# Patient Record
Sex: Female | Born: 1960 | Race: Black or African American | Hispanic: No | Marital: Single | Smoking: Current every day smoker
Health system: Southern US, Community
[De-identification: ages and names within clinical notes are randomized; demographics above are authoritative.]

## PROBLEM LIST (undated history)

## (undated) DIAGNOSIS — I10 Essential (primary) hypertension: Secondary | ICD-10-CM

## (undated) DIAGNOSIS — R109 Unspecified abdominal pain: Secondary | ICD-10-CM

## (undated) DIAGNOSIS — N809 Endometriosis, unspecified: Secondary | ICD-10-CM

## (undated) DIAGNOSIS — J45909 Unspecified asthma, uncomplicated: Secondary | ICD-10-CM

## (undated) DIAGNOSIS — G4730 Sleep apnea, unspecified: Secondary | ICD-10-CM

## (undated) DIAGNOSIS — N938 Other specified abnormal uterine and vaginal bleeding: Secondary | ICD-10-CM

## (undated) DIAGNOSIS — R51 Headache: Secondary | ICD-10-CM

## (undated) DIAGNOSIS — N949 Unspecified condition associated with female genital organs and menstrual cycle: Secondary | ICD-10-CM

## (undated) DIAGNOSIS — N83209 Unspecified ovarian cyst, unspecified side: Secondary | ICD-10-CM

## (undated) DIAGNOSIS — O009 Unspecified ectopic pregnancy without intrauterine pregnancy: Secondary | ICD-10-CM

## (undated) DIAGNOSIS — N925 Other specified irregular menstruation: Secondary | ICD-10-CM

## (undated) DIAGNOSIS — F411 Generalized anxiety disorder: Secondary | ICD-10-CM

## (undated) DIAGNOSIS — R519 Headache, unspecified: Secondary | ICD-10-CM

## (undated) HISTORY — DX: Unspecified asthma, uncomplicated: J45.909

## (undated) HISTORY — DX: Unspecified ovarian cyst, unspecified side: N83.209

## (undated) HISTORY — DX: Unspecified condition associated with female genital organs and menstrual cycle: N94.9

## (undated) HISTORY — DX: Generalized anxiety disorder: F41.1

## (undated) HISTORY — DX: Other specified abnormal uterine and vaginal bleeding: N93.8

## (undated) HISTORY — PX: ECTOPIC PREGNANCY SURGERY: SHX613

## (undated) HISTORY — PX: OVARIAN CYST REMOVAL: SHX89

## (undated) HISTORY — DX: Other specified irregular menstruation: N92.5

## (undated) HISTORY — DX: Essential (primary) hypertension: I10

## (undated) HISTORY — DX: Unspecified ectopic pregnancy without intrauterine pregnancy: O00.90

## (undated) HISTORY — DX: Endometriosis, unspecified: N80.9

---

## 2004-09-18 ENCOUNTER — Emergency Department: Payer: Self-pay | Admitting: Emergency Medicine

## 2004-09-19 ENCOUNTER — Other Ambulatory Visit: Payer: Self-pay

## 2004-10-30 ENCOUNTER — Emergency Department (HOSPITAL_COMMUNITY): Admission: EM | Admit: 2004-10-30 | Discharge: 2004-10-30 | Payer: Self-pay | Admitting: Emergency Medicine

## 2005-03-04 ENCOUNTER — Emergency Department: Payer: Self-pay | Admitting: Emergency Medicine

## 2005-03-26 ENCOUNTER — Emergency Department: Payer: Self-pay | Admitting: Emergency Medicine

## 2005-03-26 ENCOUNTER — Other Ambulatory Visit: Payer: Self-pay

## 2005-04-15 ENCOUNTER — Emergency Department: Payer: Self-pay | Admitting: Emergency Medicine

## 2005-06-09 ENCOUNTER — Emergency Department: Payer: Self-pay | Admitting: Emergency Medicine

## 2005-06-09 ENCOUNTER — Other Ambulatory Visit: Payer: Self-pay

## 2006-11-28 ENCOUNTER — Emergency Department: Payer: Self-pay | Admitting: Emergency Medicine

## 2007-07-05 ENCOUNTER — Emergency Department: Payer: Self-pay | Admitting: Emergency Medicine

## 2008-03-22 ENCOUNTER — Emergency Department: Payer: Self-pay | Admitting: Emergency Medicine

## 2008-03-24 ENCOUNTER — Ambulatory Visit: Payer: Self-pay | Admitting: Emergency Medicine

## 2008-03-27 ENCOUNTER — Ambulatory Visit: Payer: Self-pay | Admitting: Unknown Physician Specialty

## 2008-03-27 ENCOUNTER — Inpatient Hospital Stay: Payer: Self-pay | Admitting: Unknown Physician Specialty

## 2009-08-22 ENCOUNTER — Emergency Department: Payer: Self-pay | Admitting: Emergency Medicine

## 2009-08-23 ENCOUNTER — Emergency Department: Payer: Self-pay | Admitting: Emergency Medicine

## 2011-11-16 ENCOUNTER — Emergency Department: Payer: Self-pay | Admitting: Emergency Medicine

## 2011-11-16 LAB — URINALYSIS, COMPLETE
Glucose,UR: NEGATIVE mg/dL (ref 0–75)
Nitrite: NEGATIVE
RBC,UR: 5 /HPF (ref 0–5)
Specific Gravity: 1.026 (ref 1.003–1.030)
WBC UR: 1 /HPF (ref 0–5)

## 2011-11-16 LAB — COMPREHENSIVE METABOLIC PANEL
Alkaline Phosphatase: 82 U/L (ref 50–136)
BUN: 11 mg/dL (ref 7–18)
Bilirubin,Total: 0.9 mg/dL (ref 0.2–1.0)
Chloride: 103 mmol/L (ref 98–107)
Co2: 27 mmol/L (ref 21–32)
Creatinine: 0.92 mg/dL (ref 0.60–1.30)
EGFR (African American): >60
Osmolality: 277 (ref 275–301)
Sodium: 139 mmol/L (ref 136–145)
Total Protein: 8.2 g/dL (ref 6.4–8.2)

## 2011-11-16 LAB — CBC
HCT: 45.3 % (ref 35.0–47.0)
MCH: 30.9 pg (ref 26.0–34.0)
MCHC: 33.7 g/dL (ref 32.0–36.0)
MCV: 92 fL (ref 80–100)
Platelet: 252 10*3/uL (ref 150–440)
RBC: 4.94 10*6/uL (ref 3.80–5.20)
RDW: 13.6 % (ref 11.5–14.5)
WBC: 8.2 10*3/uL (ref 3.6–11.0)

## 2011-11-16 LAB — CK TOTAL AND CKMB (NOT AT ARMC): CK-MB: <0.5 ng/mL — ABNORMAL LOW (ref 0.5–3.6)

## 2011-11-19 ENCOUNTER — Encounter: Payer: Self-pay | Admitting: Cardiovascular Disease

## 2011-11-22 ENCOUNTER — Encounter: Payer: Self-pay | Admitting: *Deleted

## 2011-11-23 ENCOUNTER — Encounter: Payer: Self-pay | Admitting: Cardiovascular Disease

## 2011-12-03 ENCOUNTER — Encounter: Payer: Self-pay | Admitting: Cardiovascular Disease

## 2011-12-14 ENCOUNTER — Encounter: Payer: Self-pay | Admitting: Cardiovascular Disease

## 2011-12-17 ENCOUNTER — Encounter: Payer: Self-pay | Admitting: Cardiovascular Disease

## 2011-12-24 ENCOUNTER — Encounter: Payer: Self-pay | Admitting: Cardiovascular Disease

## 2012-01-04 ENCOUNTER — Encounter: Payer: Self-pay | Admitting: Cardiovascular Disease

## 2012-01-04 ENCOUNTER — Ambulatory Visit (INDEPENDENT_AMBULATORY_CARE_PROVIDER_SITE_OTHER): Payer: Self-pay | Admitting: Cardiovascular Disease

## 2012-01-04 VITALS — BP 126/91 | HR 81 | Ht 65.5 in | Wt 137.5 lb

## 2012-01-04 DIAGNOSIS — R0602 Shortness of breath: Secondary | ICD-10-CM

## 2012-01-04 DIAGNOSIS — R002 Palpitations: Secondary | ICD-10-CM | POA: Insufficient documentation

## 2012-01-04 NOTE — Patient Instructions (Addendum)
Your physician has recommended that you wear a holter monitor. Holter monitors are medical devices that record the heart's electrical activity. Doctors most often use these monitors to diagnose arrhythmias. Arrhythmias are problems with the speed or rhythm of the heartbeat. The monitor is a small, portable device. You can wear one while you do your normal daily activities. This is usually used to diagnose what is causing palpitations/syncope (passing out).  Will call you with results.

## 2012-01-04 NOTE — Assessment & Plan Note (Signed)
The patient's palpitations are suggestive of premature beats and possible supraventricular tachycardia. She did have SVT in 2007. Her baseline ECG is normal with no evidence of a delta wave and normal QT interval. She also has a lot of anxiety symptoms and these might be contributing. I recommend evaluation with a 48-hour Holter monitor. She did have an echocardiogram in the past and was unremarkable. I will consider increasing the dose of metoprolol. She also might need to have her thyroid function checked if that has not already been done.

## 2012-01-04 NOTE — Progress Notes (Signed)
HPI  This is a 51 year old female who is here today for evaluation of palpitations and followup from the emergency room at [facility]. She has history of hypertension, anxiety and tobacco use. In 2007, she presented to [facility] with palpitations. She was noted to have wide complex tachycardia at a heart rate of 150 beats per minute which was felt to be due to SVT with aberrant conduction. She was seen by Dr. Juliann Pares at that time. According to the notes, she had a stress test and an echocardiogram. Both of them were unremarkable. She was treated with small dose metoprolol as well as Xanax for anxiety. She reports recurrent brief palpitations since then. However, last month she had an episode that lasted longer than usual. She felt skipping in her heart and slightly fast heartbeats. She was extremely dizzy and had a presyncopal episode. These episodes are usually very random it can happen at any time. They can happen even if she is not stressed. She denies any chest pain or significant dyspnea. She had basic workup in the emergency room. Her labs were unremarkable. ECG showed normal sinus rhythm. She works as a Lawyer.  No Known Allergies   Current Outpatient Prescriptions on File Prior to Visit  Medication Sig Dispense Refill  . ALPRAZolam (XANAX) 0.5 MG tablet Take 0.5 mg by mouth at bedtime as needed.      . hydrochlorothiazide (MICROZIDE) 12.5 MG capsule Take 12.5 mg by mouth daily.      . metoprolol tartrate (LOPRESSOR) 12.5 mg TABS Take 12.5 mg by mouth 2 (two) times daily.         Past Medical History  Diagnosis Date  . Other and unspecified ovarian cyst   . Essential hypertension, benign   . Anxiety state, unspecified   . Other disorder of menstruation and other abnormal bleeding from female genital tract   . Ectopic pregnancy   . Asthma      Past Surgical History  Procedure Date  . Ectopic pregnancy surgery      Family History  Problem Relation Age of Onset  . Hypertension Mother     . Hypertension Father   . Hypertension Sister   . Hypertension Maternal Grandmother   . Heart disease Maternal Grandfather   . Hypertension Sister      History   Social History  . Marital Status: Single    Spouse Name: N/A    Number of Children: N/A  . Years of Education: N/A   Occupational History  . Not on file.   Social History Main Topics  . Smoking status: Current Every Day Smoker -- 2.0 packs/day for 10 years    Types: Cigarettes  . Smokeless tobacco: Not on file  . Alcohol Use: Yes     socially  . Drug Use: No  . Sexually Active:    Other Topics Concern  . Not on file   Social History Narrative  . No narrative on file     ROS Constitutional: Negative for fever, chills, diaphoresis, activity change, appetite change and fatigue.  HENT: Negative for hearing loss, nosebleeds, congestion, sore throat, facial swelling, drooling, trouble swallowing, neck pain, voice change, sinus pressure and tinnitus.  Eyes: Negative for photophobia, pain, discharge and visual disturbance.  Respiratory: Negative for apnea, cough, chest tightness and wheezing.  Cardiovascular: Negative for chest pain and leg swelling.  Gastrointestinal: Negative for nausea, vomiting, abdominal pain, diarrhea, constipation, blood in stool and abdominal distention.  Genitourinary: Negative for dysuria, urgency, frequency, hematuria  and decreased urine volume.  Musculoskeletal: Negative for myalgias, back pain, joint swelling, arthralgias and gait problem.  Skin: Negative for color change, pallor, rash and wound.  Neurological: Negative for dizziness, tremors, seizures, syncope, speech difficulty, weakness, light-headedness, numbness and headaches.  Psychiatric/Behavioral: Negative for suicidal ideas, hallucinations, behavioral problems and agitation. The patient is not nervous/anxious.     PHYSICAL EXAM   BP 126/91  Pulse 81  Ht 5' 5.5" (1.664 m)  Wt 137 lb 8 oz (62.37 kg)  BMI 22.53  kg/m2 Constitutional: She is oriented to person, place, and time. She appears well-developed and well-nourished. No distress.  HENT: No nasal discharge.  Head: Normocephalic and atraumatic.  Eyes: Pupils are equal and round. Right eye exhibits no discharge. Left eye exhibits no discharge.  Neck: Normal range of motion. Neck supple. No JVD present. No thyromegaly present.  Cardiovascular: Normal rate, regular rhythm, normal heart sounds. Exam reveals no gallop and no friction rub. No murmur heard.  Pulmonary/Chest: Effort normal and breath sounds normal. No stridor. No respiratory distress. She has no wheezes. She has no rales. She exhibits no tenderness.  Abdominal: Soft. Bowel sounds are normal. She exhibits no distension. There is no tenderness. There is no rebound and no guarding.  Musculoskeletal: Normal range of motion. She exhibits no edema and no tenderness.  Neurological: She is alert and oriented to person, place, and time. Coordination normal.  Skin: Skin is warm and dry. No rash noted. She is not diaphoretic. No erythema. No pallor.  Psychiatric: She has a normal mood and affect. Her behavior is normal. Judgment and thought content normal.     EKG: Sinus  Rhythm  WITHIN NORMAL LIMITS   ASSESSMENT AND PLAN

## 2013-11-06 ENCOUNTER — Ambulatory Visit: Payer: Self-pay | Admitting: Physician Assistant

## 2015-03-23 ENCOUNTER — Encounter: Payer: Self-pay | Admitting: Emergency Medicine

## 2015-03-23 ENCOUNTER — Emergency Department
Admission: EM | Admit: 2015-03-23 | Discharge: 2015-03-23 | Disposition: A | Discharge status: Home / Self Care | Payer: Self-pay | Attending: Emergency Medicine | Admitting: Emergency Medicine

## 2015-03-23 ENCOUNTER — Emergency Department: Payer: Self-pay

## 2015-03-23 DIAGNOSIS — F17210 Nicotine dependence, cigarettes, uncomplicated: Secondary | ICD-10-CM | POA: Insufficient documentation

## 2015-03-23 DIAGNOSIS — R42 Dizziness and giddiness: Secondary | ICD-10-CM | POA: Insufficient documentation

## 2015-03-23 DIAGNOSIS — I493 Ventricular premature depolarization: Secondary | ICD-10-CM | POA: Insufficient documentation

## 2015-03-23 DIAGNOSIS — Z79899 Other long term (current) drug therapy: Secondary | ICD-10-CM | POA: Insufficient documentation

## 2015-03-23 DIAGNOSIS — J45901 Unspecified asthma with (acute) exacerbation: Secondary | ICD-10-CM | POA: Insufficient documentation

## 2015-03-23 DIAGNOSIS — I10 Essential (primary) hypertension: Secondary | ICD-10-CM | POA: Insufficient documentation

## 2015-03-23 LAB — URINALYSIS COMPLETE WITH MICROSCOPIC (ARMC ONLY)
Bacteria, UA: NONE SEEN
Bilirubin Urine: NEGATIVE
GLUCOSE, UA: NEGATIVE mg/dL
Hgb urine dipstick: NEGATIVE
KETONES UR: NEGATIVE mg/dL
Leukocytes, UA: NEGATIVE
NITRITE: NEGATIVE
PROTEIN: NEGATIVE mg/dL
SPECIFIC GRAVITY, URINE: 1.017 (ref 1.005–1.030)
pH: 7 (ref 5.0–8.0)

## 2015-03-23 LAB — BASIC METABOLIC PANEL
Anion gap: 4 — ABNORMAL LOW (ref 5–15)
BUN: 9 mg/dL (ref 6–20)
CALCIUM: 9 mg/dL (ref 8.9–10.3)
CO2: 28 mmol/L (ref 22–32)
Chloride: 109 mmol/L (ref 101–111)
Creatinine, Ser: 0.79 mg/dL (ref 0.44–1.00)
GFR calc Af Amer: >60 mL/min (ref >60)
GLUCOSE: 86 mg/dL (ref 65–99)
POTASSIUM: 3.6 mmol/L (ref 3.5–5.1)
SODIUM: 141 mmol/L (ref 135–145)

## 2015-03-23 LAB — TROPONIN I: Troponin I: <0.03 ng/mL (ref <0.031)

## 2015-03-23 LAB — CBC
HCT: 39.9 % (ref 35.0–47.0)
HEMOGLOBIN: 13 g/dL (ref 12.0–16.0)
MCH: 27.2 pg (ref 26.0–34.0)
MCHC: 32.5 g/dL (ref 32.0–36.0)
MCV: 83.5 fL (ref 80.0–100.0)
Platelets: 297 10*3/uL (ref 150–440)
RBC: 4.78 MIL/uL (ref 3.80–5.20)
RDW: 16.2 % — ABNORMAL HIGH (ref 11.5–14.5)
WBC: 7.8 10*3/uL (ref 3.6–11.0)

## 2015-03-23 NOTE — ED Notes (Signed)
Pt reports feeling light headed/dizzy/SOB since Friday.

## 2015-03-23 NOTE — ED Notes (Signed)
AAOx3.  Skin warm and dry.  NAD.  Ambualates with easy and steady gait. Moving all extremities equally and strong.

## 2015-03-23 NOTE — ED Provider Notes (Signed)
[facility] Emergency Department Provider Note  ____________________________________________  Time seen: Approximately 5:39 PM  I have reviewed the triage vital signs and the nursing notes.   HISTORY  Chief Complaint Dizziness and Shortness of Breath    HPI Tanya Cline is a 54 y.o. female history of anemia, HTN with no recent medication changes, presenting with lightheadedness.Patient reports that since Friday she has been having lightheadedness "like I'm going to pass out" that is worse with standing. She feels that she may have some shortness of breath or "I can't tell if I'm short of breath because I'm feeling anxious" associated with this. No chest pain, tightness, or pressure sensation. No palpitations. No numbness tingling or weakness, headache. No recent nausea, vomiting diarrhea fever or cough or cold symptoms. She has been taking normal by mouth. No recent changes in her medications other than increase in her blood pressure medication several months ago. No trauma. No black or tarry-colored looking stools.   Past Medical History  Diagnosis Date  . Other and unspecified ovarian cyst   . Essential hypertension, benign   . Anxiety state, unspecified   . Other disorder of menstruation and other abnormal bleeding from female genital tract   . Ectopic pregnancy   . Asthma     Patient Active Problem List   Diagnosis Date Noted  . Palpitations 01/04/2012    Past Surgical History  Procedure Laterality Date  . Ectopic pregnancy surgery      Current Outpatient Rx  Name  Route  Sig  Dispense  Refill  . ALPRAZolam (XANAX) 0.5 MG tablet   Oral   Take 0.5 mg by mouth at bedtime as needed.         . hydrochlorothiazide (MICROZIDE) 12.5 MG capsule   Oral   Take 12.5 mg by mouth daily.         . metoprolol tartrate (LOPRESSOR) 12.5 mg TABS   Oral   Take 12.5 mg by mouth 2 (two) times daily.         . Multiple Vitamin  (MULTIVITAMIN) tablet   Oral   Take 1 tablet by mouth daily.           Allergies Review of patient's allergies indicates no known allergies.  Family History  Problem Relation Age of Onset  . Hypertension Mother   . Hypertension Father   . Hypertension Sister   . Hypertension Maternal Grandmother   . Heart disease Maternal Grandfather   . Hypertension Sister     Social History Social History  Substance Use Topics  . Smoking status: Current Every Day Smoker -- 2.00 packs/day for 10 years    Types: Cigarettes  . Smokeless tobacco: None  . Alcohol Use: Yes     Comment: socially    Review of Systems Constitutional: No fever/chills. Positive lightheadedness. No syncope. Eyes: No visual changes. No blurred or double vision. ENT: No sore throat. No cough or cold symptoms. Cardiovascular: Denies chest pain, palpitations. Respiratory: Positive shortness of breath.  No cough. Gastrointestinal: No abdominal pain.  No nausea, no vomiting.  No diarrhea.  No constipation. Genitourinary: Negative for dysuria. Musculoskeletal: Negative for back pain. Skin: Negative for rash. Neurological: Negative for headaches, focal weakness or numbness.  10-point ROS otherwise negative.  ____________________________________________   PHYSICAL EXAM:  VITAL SIGNS: ED Triage Vitals  Enc Vitals Group     BP 03/23/15 1649 151/83 mmHg     Pulse Rate 03/23/15 1649 97     Resp 03/23/15  1649 18     Temp 03/23/15 1649 97.8 F (36.6 C)     Temp Source 03/23/15 1649 Oral     SpO2 03/23/15 1649 96 %     Weight 03/23/15 1649 130 lb (58.968 kg)     Height --      Head Cir --      Peak Flow --      Pain Score 03/23/15 1708 0     Pain Loc --      Pain Edu? --      Excl. in [place]? --     Constitutional: Alert and oriented. Well appearing and in no acute distress. Answer question appropriately. Eyes: Conjunctivae are normal.  EOMI. no conjunctival pallor. Head: Atraumatic. Nose: No  congestion/rhinnorhea. Mouth/Throat: Mucous membranes are moist.  Neck: No stridor.  Supple.  No JVD. Cardiovascular: Normal rate, regular rhythm. No murmurs, rubs or gallops.  Respiratory: Normal respiratory effort.  No retractions. Lungs CTAB.  No wheezes, rales or ronchi. Gastrointestinal: Soft and nontender. No distention. No peritoneal signs. Musculoskeletal: No LE edema. No palpable cords, calf pain, or Homans sign. Neurologic:  Alert and oriented 3. Face and smile are symmetric. Normal speech. EOMI and PERRLA. Moves all extremities well. Normal gait without ataxia.  Skin:  Skin is warm, dry and intact. No rash noted. Psychiatric: Mood and affect are normal. Speech and behavior are normal.  Normal judgement.  ____________________________________________   LABS (all labs ordered are listed, but only abnormal results are displayed)  Labs Reviewed  BASIC METABOLIC PANEL - Abnormal; Notable for the following:    Anion gap 4 (*)    All other components within normal limits  CBC - Abnormal; Notable for the following:    RDW 16.2 (*)    All other components within normal limits  URINALYSIS COMPLETEWITH MICROSCOPIC ([facility] ONLY) - Abnormal; Notable for the following:    Color, Urine YELLOW (*)    APPearance HAZY (*)    Squamous Epithelial / LPF 6-30 (*)    All other components within normal limits  TROPONIN I  CBG MONITORING, ED   ____________________________________________  EKG  ED ECG REPORT I, Eula Listen, the attending physician, personally viewed and interpreted this ECG.   Date: 03/23/2015  EKG Time: 1654  Rate: 73  Rhythm: normal sinus rhythm  Axis: Normal  Intervals:none  ST&T Change: Nonspecific T-wave inversion in V1. No ischemic changes.  ____________________________________________  RADIOLOGY  No results found.  ____________________________________________   PROCEDURES  Procedure(s) performed: None  Critical Care performed:  No ____________________________________________   INITIAL IMPRESSION / ASSESSMENT AND PLAN / ED COURSE  Pertinent labs & imaging results that were available during my care of the patient were reviewed by me and considered in my medical decision making (see chart for details).  54 y.o. female with a history of anemia, HTN on antihypertensives, presenting with 3 days of lightheadedness which is worse with standing. She has no clinical history or signs or symptoms of infection but I will rule out UTI; pneumonia would be very unlikely. We will also check her for anemia. Rule out electrolyte abnormality or arrhythmia. If the patient's workup here is negative, I anticipate discharge home with PCP follow-up. It is possible that she is symptomatic from her medications, but her blood pressure today is not low nor is she bradycardic. The patient does have 1 PVC on her EKG and is possible that she is having more than this, but at the rate that we are seeing on  the EKG it is unlikely this would be the cause of her lightheadedness.  ____________________________________________  FINAL CLINICAL IMPRESSION(S) / ED DIAGNOSES  Final diagnoses:  Lightheadedness  PVC (premature ventricular contraction)      NEW MEDICATIONS STARTED DURING THIS VISIT:  New Prescriptions   No medications on file     Eula Listen, MD 03/23/15 1813

## 2015-03-23 NOTE — ED Notes (Signed)
Also c/o recent history of right ear pain.

## 2015-03-23 NOTE — Discharge Instructions (Signed)
Please drink plenty of fluid to stay well hydrated. Please make a follow-up appointment with your primary care physician.  Please return to the emergency department if he develops severe pain, numbness tingling or weakness, severe headache, vomiting, fever, or any other symptoms concerning to you. Dizziness Dizziness is a common problem. It is a feeling of unsteadiness or light-headedness. You may feel like you are about to faint. Dizziness can lead to injury if you stumble or fall. Anyone can become dizzy, but dizziness is more common in older adults. This condition can be caused by a number of things, including medicines, dehydration, or illness. HOME CARE INSTRUCTIONS Taking these steps may help with your condition: Eating and Drinking  Drink enough fluid to keep your urine clear or pale yellow. This helps to keep you from becoming dehydrated. Try to drink more clear fluids, such as water.  Do not drink alcohol.  Limit your caffeine intake if directed by your health care provider.  Limit your salt intake if directed by your health care provider. Activity  Avoid making quick movements.  Rise slowly from chairs and steady yourself until you feel okay.  In the morning, first sit up on the side of the bed. When you feel okay, stand slowly while you hold onto something until you know that your balance is fine.  Move your legs often if you need to stand in one place for a long time. Tighten and relax your muscles in your legs while you are standing.  Do not drive or operate heavy machinery if you feel dizzy.  Avoid bending down if you feel dizzy. Place items in your home so that they are easy for you to reach without leaning over. Lifestyle  Do not use any tobacco products, including cigarettes, chewing tobacco, or electronic cigarettes. If you need help quitting, ask your health care provider.  Try to reduce your stress level, such as with yoga or meditation. Talk with your health care  provider if you need help. General Instructions  Watch your dizziness for any changes.  Take medicines only as directed by your health care provider. Talk with your health care provider if you think that your dizziness is caused by a medicine that you are taking.  Tell a friend or a family member that you are feeling dizzy. If he or she notices any changes in your behavior, have this person call your health care provider.  Keep all follow-up visits as directed by your health care provider. This is important. SEEK MEDICAL CARE IF:  Your dizziness does not go away.  Your dizziness or light-headedness gets worse.  You feel nauseous.  You have reduced hearing.  You have new symptoms.  You are unsteady on your feet or you feel like the room is spinning. SEEK IMMEDIATE MEDICAL CARE IF:  You vomit or have diarrhea and are unable to eat or drink anything.  You have problems talking, walking, swallowing, or using your arms, hands, or legs.  You feel generally weak.  You are not thinking clearly or you have trouble forming sentences. It may take a friend or family member to notice this.  You have chest pain, abdominal pain, shortness of breath, or sweating.  Your vision changes.  You notice any bleeding.  You have a headache.  You have neck pain or a stiff neck.  You have a fever.   This information is not intended to replace advice given to you by your health care provider. Make sure you discuss  any questions you have with your health care provider.   Document Released: 09/22/2000 Document Revised: 08/13/2014 Document Reviewed: 03/25/2014 Elsevier Interactive Patient Education [employer].

## 2015-06-15 ENCOUNTER — Encounter: Payer: Self-pay | Admitting: Gynecology

## 2015-06-15 ENCOUNTER — Ambulatory Visit (INDEPENDENT_AMBULATORY_CARE_PROVIDER_SITE_OTHER): Payer: Self-pay

## 2015-06-15 ENCOUNTER — Ambulatory Visit
Admission: EM | Admit: 2015-06-15 | Discharge: 2015-06-15 | Disposition: A | Discharge status: Home / Self Care | Payer: Self-pay | Attending: Family Medicine | Admitting: Family Medicine

## 2015-06-15 DIAGNOSIS — M940 Chondrocostal junction syndrome [Tietze]: Secondary | ICD-10-CM

## 2015-06-15 DIAGNOSIS — J069 Acute upper respiratory infection, unspecified: Secondary | ICD-10-CM

## 2015-06-15 NOTE — ED Notes (Signed)
Patient stated was congestion x 1 week ago and was given amoxicillin. Today patient stated with chest pain and was concerned.

## 2015-06-15 NOTE — ED Provider Notes (Addendum)
CSN: [id number]     Arrival date & time 06/15/15  1325 History   First MD Initiated Contact with Patient 06/15/15 1437     Chief Complaint  Patient presents with  . Chest Pain   (Consider location/radiation/quality/duration/timing/severity/associated sxs/prior Treatment) HPI: Patient presents today with symptoms of chest congestion for a week. Patient's primary care physician did give her amoxicillin. She took it for a few days and then decided that the medicine was making her feel "weird." Today she started to have some discomfort in her chest with coughing. She denies any shortness of breath, diaphoresis, fever, headache. She also noticed the pain with certain movements. She denies any known history of cardiac problems. She denies any family history of some cardiac death. She is a smoker. Patient notes to having asthma as a child. She does not use an albuterol inhaler anymore.  Past Medical History  Diagnosis Date  . Other and unspecified ovarian cyst   . Essential hypertension, benign   . Anxiety state, unspecified   . Other disorder of menstruation and other abnormal bleeding from female genital tract   . Ectopic pregnancy   . Asthma    Past Surgical History  Procedure Laterality Date  . Ectopic pregnancy surgery     Family History  Problem Relation Age of Onset  . Hypertension Mother   . Hypertension Father   . Hypertension Sister   . Hypertension Maternal Grandmother   . Heart disease Maternal Grandfather   . Hypertension Sister    Social History  Substance Use Topics  . Smoking status: Current Every Day Smoker -- 2.00 packs/day for 10 years    Types: Cigarettes  . Smokeless tobacco: None  . Alcohol Use: Yes     Comment: socially   OB History    No data available     Review of Systems: Negative except mentioned above.   Allergies  Review of patient's allergies indicates no known allergies.  Home Medications   Prior to Admission medications   Medication Sig  Start Date End Date Taking? Authorizing Provider  clonazePAM (KLONOPIN) 0.5 MG tablet Take 0.5 mg by mouth 2 (two) times daily as needed for anxiety.   Yes Historical Provider, MD  Multiple Vitamin (MULTIVITAMIN) tablet Take 1 tablet by mouth daily.   Yes Historical Provider, MD  verapamil (CALAN-SR) 180 MG CR tablet Take 180 mg by mouth at bedtime.   Yes Historical Provider, MD  ALPRAZolam Duanne Moron) 0.5 MG tablet Take 0.5 mg by mouth at bedtime as needed.    Historical Provider, MD  hydrochlorothiazide (MICROZIDE) 12.5 MG capsule Take 12.5 mg by mouth daily.    Historical Provider, MD  metoprolol tartrate (LOPRESSOR) 12.5 mg TABS Take 12.5 mg by mouth 2 (two) times daily.    Historical Provider, MD   Meds Ordered and Administered this Visit  Medications - No data to display  BP 151/89 mmHg  Pulse 83  Temp(Src) 97.6 F (36.4 C) (Oral)  Resp 16  Ht 5\' 6"  (1.676 m)  Wt 140 lb (63.504 kg)  BMI 22.61 kg/m2  SpO2 99%  LMP 03/17/2015 (Approximate) No data found.   Physical Exam:  GENERAL: NAD HEENT: no pharyngeal erythema, no exudate, no erythema of TMs, no cervical LAD RESP: CTA B, minimal tenderness to palpation along right parasternal area CARD: RRR NEURO: CN II-XII grossly intact   ED Course  Procedures (including critical care time)  Labs Review Labs Reviewed - No data to display  Imaging Review Dg Chest 2  View  06/15/2015  CLINICAL DATA:  Chest discomfort for 1 week with cough and congestion EXAM: CHEST  2 VIEW COMPARISON:  03/23/2015 FINDINGS: Mild left base scarring stable. Heart size and vascular pattern normal. Lungs otherwise clear. IMPRESSION: No acute findings Electronically Signed   By: Skipper Cliche M.D.   On: 06/15/2015 15:28     MDM   1. Costochondritis   2. URI (upper respiratory infection)   Discussed results of chest x-ray with patient and also EKG. EKG shows normal sinus rhythm with reading that states possible left atrial enlargement with no ST elevation  or depression no T-wave inversions noted P waves seen, agree with printout version of EKG. Discussed with patient that I would recommend her taking a cough suppressant and taking ibuprofen when necessary for her symptoms. I do not feel at this point that her symptoms are cardiac in nature. If her symptoms do persist or worsen I do recommend that she seek medical attention as discussed. Encouraged smoking cessation.    Paulina Fusi, MD 06/15/15 1544  Paulina Fusi, MD 06/15/15 [phone]

## 2015-08-15 ENCOUNTER — Encounter: Payer: Self-pay | Admitting: *Deleted

## 2015-08-15 ENCOUNTER — Ambulatory Visit
Admission: EM | Admit: 2015-08-15 | Discharge: 2015-08-15 | Disposition: A | Discharge status: Home / Self Care | Payer: Self-pay | Attending: Family Medicine | Admitting: Family Medicine

## 2015-08-15 DIAGNOSIS — R42 Dizziness and giddiness: Secondary | ICD-10-CM

## 2015-08-15 DIAGNOSIS — R748 Abnormal levels of other serum enzymes: Secondary | ICD-10-CM

## 2015-08-15 DIAGNOSIS — R5383 Other fatigue: Secondary | ICD-10-CM

## 2015-08-15 LAB — CBC WITH DIFFERENTIAL/PLATELET
Basophils Absolute: 0.1 10*3/uL (ref 0–0.1)
Basophils Relative: 1 %
EOS ABS: 0.2 10*3/uL (ref 0–0.7)
Eosinophils Relative: 2 %
HEMATOCRIT: 38.7 % (ref 35.0–47.0)
HEMOGLOBIN: 12.5 g/dL (ref 12.0–16.0)
LYMPHS ABS: 1.9 10*3/uL (ref 1.0–3.6)
Lymphocytes Relative: 21 %
MCH: 26.7 pg (ref 26.0–34.0)
MCHC: 32.2 g/dL (ref 32.0–36.0)
MCV: 82.9 fL (ref 80.0–100.0)
Monocytes Absolute: 0.9 10*3/uL (ref 0.2–0.9)
NEUTROS ABS: 6 10*3/uL (ref 1.4–6.5)
Platelets: 263 10*3/uL (ref 150–440)
RBC: 4.67 MIL/uL (ref 3.80–5.20)
RDW: 16.2 % — ABNORMAL HIGH (ref 11.5–14.5)
WBC: 9.1 10*3/uL (ref 3.6–11.0)

## 2015-08-15 LAB — COMPREHENSIVE METABOLIC PANEL
ALK PHOS: 80 U/L (ref 38–126)
ALT: 64 U/L — ABNORMAL HIGH (ref 14–54)
AST: 51 U/L — ABNORMAL HIGH (ref 15–41)
Albumin: 3.9 g/dL (ref 3.5–5.0)
Anion gap: 7 (ref 5–15)
BILIRUBIN TOTAL: 0.7 mg/dL (ref 0.3–1.2)
BUN: 13 mg/dL (ref 6–20)
CHLORIDE: 105 mmol/L (ref 101–111)
CO2: 24 mmol/L (ref 22–32)
CREATININE: 0.79 mg/dL (ref 0.44–1.00)
Calcium: 9.3 mg/dL (ref 8.9–10.3)
GFR calc non Af Amer: >60 mL/min (ref >60)
Glucose, Bld: 97 mg/dL (ref 65–99)
Potassium: 3.8 mmol/L (ref 3.5–5.1)
Sodium: 136 mmol/L (ref 135–145)
Total Protein: 7.4 g/dL (ref 6.5–8.1)

## 2015-08-15 NOTE — ED Notes (Signed)
Pt took her BP med this am and shortly thereafter began feeling tired, weak, headache and dizzy.

## 2015-09-23 NOTE — ED Provider Notes (Signed)
CSN: [id number]     Arrival date & time 08/15/15  1601 History   First MD Initiated Contact with Patient 08/15/15 1749     Chief Complaint  Patient presents with  . Dizziness  . Weakness   (Consider location/radiation/quality/duration/timing/severity/associated sxs/prior Treatment) HPI Comments: 55 yo female with a c/o dizziness since this morning after taking her blood pressure medication. States also felt tired, weak, fatigued and developed a headache. Denies any fevers, chills, vision changes, numbness/tingling or one-sided weakness.  Patient is a 55 y.o. female presenting with dizziness and weakness. The history is provided by the patient.  Dizziness Quality:  Lightheadedness Associated symptoms: weakness   Weakness    Past Medical History  Diagnosis Date  . Other and unspecified ovarian cyst   . Essential hypertension, benign   . Anxiety state, unspecified   . Other disorder of menstruation and other abnormal bleeding from female genital tract   . Ectopic pregnancy   . Asthma    Past Surgical History  Procedure Laterality Date  . Ectopic pregnancy surgery     Family History  Problem Relation Age of Onset  . Hypertension Mother   . Hypertension Father   . Hypertension Sister   . Hypertension Maternal Grandmother   . Heart disease Maternal Grandfather   . Hypertension Sister    Social History  Substance Use Topics  . Smoking status: Current Every Day Smoker -- 2.00 packs/day for 10 years    Types: Cigarettes  . Smokeless tobacco: None  . Alcohol Use: Yes     Comment: socially   OB History    No data available     Review of Systems  Neurological: Positive for dizziness and weakness.    Allergies  Review of patient's allergies indicates no known allergies.  Home Medications   Prior to Admission medications   Medication Sig Start Date End Date Taking? Authorizing Provider  Multiple Vitamin (MULTIVITAMIN) tablet Take 1 tablet by mouth daily.   Yes  Historical Provider, MD  verapamil (CALAN-SR) 180 MG CR tablet Take 180 mg by mouth at bedtime.   Yes Historical Provider, MD  ALPRAZolam Duanne Moron) 0.5 MG tablet Take 0.5 mg by mouth at bedtime as needed.    Historical Provider, MD  clonazePAM (KLONOPIN) 0.5 MG tablet Take 0.5 mg by mouth 2 (two) times daily as needed for anxiety.    Historical Provider, MD  hydrochlorothiazide (MICROZIDE) 12.5 MG capsule Take 12.5 mg by mouth daily.    Historical Provider, MD  metoprolol tartrate (LOPRESSOR) 12.5 mg TABS Take 12.5 mg by mouth 2 (two) times daily.    Historical Provider, MD   Meds Ordered and Administered this Visit  Medications - No data to display  BP 128/90 mmHg  Pulse 84  Temp(Src) 98.1 F (36.7 C) (Oral)  Resp 16  Ht 5\' 6"  (1.676 m)  Wt 140 lb (63.504 kg)  BMI 22.61 kg/m2  SpO2 100%  LMP 07/16/2015 (Approximate) No data found.   Physical Exam  Constitutional: She is oriented to person, place, and time. She appears well-developed and well-nourished. No distress.  HENT:  Head: Normocephalic.  Right Ear: Tympanic membrane, external ear and ear canal normal.  Left Ear: Tympanic membrane, external ear and ear canal normal.  Nose: Nose normal.  Mouth/Throat: Uvula is midline, oropharynx is clear and moist and mucous membranes are normal.  Eyes: Conjunctivae and EOM are normal. Pupils are equal, round, and reactive to light. Right eye exhibits no discharge. Left eye exhibits no discharge.  No scleral icterus.  Neck: Normal range of motion. Neck supple. No JVD present. No tracheal deviation present. No thyromegaly present.  Cardiovascular: Normal rate, regular rhythm, normal heart sounds and intact distal pulses.   No murmur heard. Pulmonary/Chest: Effort normal and breath sounds normal. No stridor. No respiratory distress. She has no wheezes. She has no rales. She exhibits no tenderness.  Abdominal: Soft. Bowel sounds are normal. She exhibits no distension and no mass. There is no  tenderness. There is no rebound and no guarding.  Musculoskeletal: She exhibits no edema or tenderness.  Lymphadenopathy:    She has no cervical adenopathy.  Neurological: She is alert and oriented to person, place, and time. She has normal reflexes.  Skin: Skin is warm and dry. No rash noted. She is not diaphoretic. No erythema. No pallor.  Psychiatric: She has a normal mood and affect. Her behavior is normal. Judgment and thought content normal.  Vitals reviewed.   ED Course  Procedures (including critical care time)  Labs Review Labs Reviewed  COMPREHENSIVE METABOLIC PANEL - Abnormal; Notable for the following:    AST 51 (*)    ALT 64 (*)    All other components within normal limits  CBC WITH DIFFERENTIAL/PLATELET - Abnormal; Notable for the following:    RDW 16.2 (*)    All other components within normal limits    Imaging Review No results found.   Visual Acuity Review  Right Eye Distance:   Left Eye Distance:   Bilateral Distance:    Right Eye Near:   Left Eye Near:    Bilateral Near:         MDM   1. Other fatigue   2. Dizziness   3. Elevated liver enzymes       Discharge Medication List as of 08/15/2015  7:11 PM     1. Labs results (normal except for elevation and diagnosis reviewed with patient; symptoms possibly secondary to medication side effect 2. Recommend supportive treatment with increased fluids, rest 4. Follow-up with PCP for recheck liver enzymes and possibly further work up; or prn if symptoms worsen or don't improve   Norval Gable, MD 09/23/15 1600

## 2016-03-31 ENCOUNTER — Emergency Department: Payer: PRIVATE HEALTH INSURANCE

## 2016-03-31 ENCOUNTER — Encounter: Payer: Self-pay | Admitting: *Deleted

## 2016-03-31 ENCOUNTER — Ambulatory Visit (INDEPENDENT_AMBULATORY_CARE_PROVIDER_SITE_OTHER)
Admission: EM | Admit: 2016-03-31 | Discharge: 2016-03-31 | Disposition: A | Discharge status: Other Acute Inpatient Hospital | Payer: PRIVATE HEALTH INSURANCE | Source: Home / Self Care | Attending: Emergency Medicine | Admitting: Emergency Medicine

## 2016-03-31 ENCOUNTER — Emergency Department
Admission: EM | Admit: 2016-03-31 | Discharge: 2016-03-31 | Disposition: A | Discharge status: Home / Self Care | Payer: PRIVATE HEALTH INSURANCE | Attending: Emergency Medicine | Admitting: Emergency Medicine

## 2016-03-31 DIAGNOSIS — I10 Essential (primary) hypertension: Secondary | ICD-10-CM | POA: Insufficient documentation

## 2016-03-31 DIAGNOSIS — J45909 Unspecified asthma, uncomplicated: Secondary | ICD-10-CM

## 2016-03-31 DIAGNOSIS — F17210 Nicotine dependence, cigarettes, uncomplicated: Secondary | ICD-10-CM | POA: Insufficient documentation

## 2016-03-31 DIAGNOSIS — F419 Anxiety disorder, unspecified: Secondary | ICD-10-CM | POA: Insufficient documentation

## 2016-03-31 DIAGNOSIS — R079 Chest pain, unspecified: Secondary | ICD-10-CM | POA: Diagnosis not present

## 2016-03-31 LAB — CBC
HCT: 44.3 % (ref 35.0–47.0)
Hemoglobin: 15 g/dL (ref 12.0–16.0)
MCH: 30.4 pg (ref 26.0–34.0)
MCHC: 33.9 g/dL (ref 32.0–36.0)
MCV: 89.6 fL (ref 80.0–100.0)
PLATELETS: 249 10*3/uL (ref 150–440)
RBC: 4.94 MIL/uL (ref 3.80–5.20)
RDW: 14.1 % (ref 11.5–14.5)
WBC: 8.2 10*3/uL (ref 3.6–11.0)

## 2016-03-31 LAB — TROPONIN I

## 2016-03-31 LAB — BASIC METABOLIC PANEL
ANION GAP: 7 (ref 5–15)
BUN: 11 mg/dL (ref 6–20)
CALCIUM: 9.8 mg/dL (ref 8.9–10.3)
CO2: 26 mmol/L (ref 22–32)
CREATININE: 0.8 mg/dL (ref 0.44–1.00)
Chloride: 105 mmol/L (ref 101–111)
GFR calc Af Amer: >60 mL/min (ref >60)
GLUCOSE: 88 mg/dL (ref 65–99)
POTASSIUM: 4.1 mmol/L (ref 3.5–5.1)
Sodium: 138 mmol/L (ref 135–145)

## 2016-03-31 MED ORDER — TRAMADOL HCL 50 MG PO TABS: 50.0000 mg | ORAL_TABLET | Freq: Four times a day (QID) | ORAL | 0 refills | Status: DC | PRN

## 2016-03-31 MED ORDER — ASPIRIN 81 MG PO CHEW
324.0000 mg | CHEWABLE_TABLET | Freq: Once | ORAL | Status: AC
  Administered 2016-03-31: 324 mg via ORAL

## 2016-03-31 NOTE — ED Provider Notes (Signed)
[facility] Emergency Department Provider Note  Time seen: 5:54 PM  I have reviewed the triage vital signs and the nursing notes.   HISTORY  Chief Complaint Chest Pain    HPI Tanya Cline is a 55 y.o. female with a past medical history of anxiety, hypertension, presents to the emergency department for chest pain. According to the patient the past 3 days she has been experiencing intermittent chest pain. She describes the pain as a sharp shooting sensation across the upper chest which lasts less than 1 second and then resolves. States the pain is coming and going. Denies any nausea, diaphoresis or shortness of breath at any point. Patient states she has had similar pain several months ago was referred to a cardiologist but has not yet followed up. [facility] and was referred to the ER for further evaluation. Patient denies any chest pain at this point. Denies any leg pain or swelling. Denies any pleuritic component to the chest pain. Patient does state she has been under a lot of stress lately and wonders attack be the problem.  Past Medical History:  Diagnosis Date  . Anxiety state, unspecified   . Asthma   . Ectopic pregnancy   . Essential hypertension, benign   . Other and unspecified ovarian cyst   . Other disorder of menstruation and other abnormal bleeding from female genital tract     Patient Active Problem List   Diagnosis Date Noted  . Palpitations 01/04/2012    Past Surgical History:  Procedure Laterality Date  . ECTOPIC PREGNANCY SURGERY      Prior to Admission medications   Medication Sig Start Date End Date Taking? Authorizing Provider  ALPRAZolam Duanne Moron) 0.5 MG tablet Take 0.5 mg by mouth at bedtime as needed.    Historical Provider, MD  clonazePAM (KLONOPIN) 0.5 MG tablet Take 0.5 mg by mouth 2 (two) times daily as needed for anxiety.    Historical Provider, MD  hydrochlorothiazide (MICROZIDE) 12.5  MG capsule Take 12.5 mg by mouth daily.    Historical Provider, MD  metoprolol tartrate (LOPRESSOR) 12.5 mg TABS Take 12.5 mg by mouth 2 (two) times daily.    Historical Provider, MD  Multiple Vitamin (MULTIVITAMIN) tablet Take 1 tablet by mouth daily.    Historical Provider, MD  verapamil (CALAN-SR) 180 MG CR tablet Take 180 mg by mouth at bedtime.    Historical Provider, MD    No Known Allergies  Family History  Problem Relation Age of Onset  . Hypertension Mother   . Hypertension Father   . Hypertension Sister   . Hypertension Maternal Grandmother   . Heart disease Maternal Grandfather   . Hypertension Sister     Social History Social History  Substance Use Topics  . Smoking status: Current Every Day Smoker    Packs/day: 2.00    Years: 10.00    Types: Cigarettes  . Smokeless tobacco: Never Used  . Alcohol use Yes     Comment: socially    Review of Systems Constitutional: Negative for fever. Cardiovascular: Intermittent sharp upper chest pains. Respiratory: Negative for shortness of breath. Gastrointestinal: Negative for abdominal pain, vomiting and diarrhea. Neurological: Negative for headache 10-point ROS otherwise negative.  ____________________________________________   PHYSICAL EXAM:  VITAL SIGNS: ED Triage Vitals  Enc Vitals Group     BP 03/31/16 1735 (!) 147/102     Pulse Rate 03/31/16 1735 73     Resp 03/31/16 1735 20  Temp 03/31/16 1735 97.9 F (36.6 C)     Temp Source 03/31/16 1735 Oral     SpO2 03/31/16 1735 100 %     Weight 03/31/16 1736 148 lb (67.1 kg)     Height 03/31/16 1736 5\' 5"  (1.651 m)     Head Circumference --      Peak Flow --      Pain Score 03/31/16 1736 0     Pain Loc --      Pain Edu? --      Excl. in Advance? --     Constitutional: Alert and oriented. Well appearing and in no distress. Eyes: Normal exam ENT   Head: Normocephalic and atraumatic.   Mouth/Throat: Mucous membranes are moist. Cardiovascular: Normal rate,  regular rhythm. No murmur Respiratory: Normal respiratory effort without tachypnea nor retractions. Breath sounds are clear  Gastrointestinal: Soft and nontender. No distention.   Musculoskeletal: Nontender with normal range of motion in all extremities.  Neurologic:  Normal speech and language. No gross focal neurologic deficits Skin:  Skin is warm, dry and intact.  Psychiatric: Mood and affect are normal.   ____________________________________________    EKG  EKG reviewed and interpreted by myself shows normal sinus rhythm at 67 bpm, narrow QRS, normal axis, normal intervals, no concerning ST changes. Overall reassuring EKG.  ____________________________________________    RADIOLOGY  X-ray negative  ____________________________________________   INITIAL IMPRESSION / ASSESSMENT AND PLAN / ED COURSE  Pertinent labs & imaging results that were available during my care of the patient were reviewed by me and considered in my medical decision making (see chart for details).  The patient presents to the emergency department with intermittent chest pain over the past 3 days. Currently asymptomatic. Received 324 mg of aspirin at [facility] urgent care. Patient's EKG is reassuring. No direct cardiac history although the patient states she has seen a cardiologist in the past, denies any MIs. States her aunt recently had a heart attack, as her only known family history. We will closely monitor in the emergency department, check labs including troponin and obtain a chest x-ray.  The patient's chest x-rays negative. Labs within normal limits including negative troponin. Given the patient's normal workup with a reassuring EKG and the patient being asymptomatic at this time we will discharge home with cardiology follow-up for a stress test. Patient is agreeable to this plan.  ____________________________________________   FINAL CLINICAL IMPRESSION(S) / ED DIAGNOSES  Chest pain    Harvest Dark, MD 03/31/16 [phone]

## 2016-03-31 NOTE — ED Notes (Signed)

## 2016-03-31 NOTE — Discharge Instructions (Signed)
You have been seen in the emergency department today for chest pain. Your workup has shown normal results. As we discussed please follow-up with your primary care physician in the next 1-2 days for recheck. Return to the emergency department for any further chest pain, trouble breathing, or any other symptom personally concerning to yourself. °

## 2016-03-31 NOTE — ED Notes (Signed)
Pt complains of intermittent chest discomfort, pt denies any other symptoms, pt reports having increased stress

## 2016-03-31 NOTE — ED Triage Notes (Signed)
Patient started having symptom of generalized chest pain yesterday. No previous history of chest pain.

## 2016-03-31 NOTE — ED Triage Notes (Addendum)
Pt sent to er from [facility] urgent care with chest pain.  Chest pain for 3 days.  No sob.  cig smoker.  No n/v/d.   Reports Throbbing pain in center of chest  Pt alert  Pt was given 4 baby asa at urgent care.

## 2016-03-31 NOTE — ED Provider Notes (Signed)
HPI  SUBJECTIVE:  Tanya Cline is a 55 y.o. female who presents with left-sided chest pain for the past 3 days that she describes as intermittent, throbbing, lasting seconds and then resolves. She reports occasional midline back pain but is not necessarily associated with the chest pain. Symptoms are worse at night, lying down and bending forward, no alleviating factors. She tried Tums and drinking Coke without improvement in her symptoms. She denies chest soreness, pressure, heaviness pain tearing through to her back. No radiation to her neck, arm, jaw. No nausea, vomiting, diaphoresis, palpitations, shortness breath, dyspnea on exertions. No dizziness, presyncope, syncope. No abdominal pain. No coughing, wheezing. No recent viral infection/URI. No exertional component. No water brash, belching, burning chest pain. 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 She has not been lifting heavier things than usual recently. No trauma to her chest. No leg pain, calf swelling, surgery in the past 4 weeks, immobilization, hemoptysis, OCPs or exogenous estrogen use. She has a past medical history of smoking, asthma, hypertension, anxiety. No history of diabetes, GERD, known coronary disease, MI, arrhythmia, atrial fibrillation, DVT, PE, hypercholesterolemia, obesity, cancer. Family history significant for an aunt with an MI in her 4s. LMP: One week ago. States that she is perimenopausal and states that there is no possibility of being pregnant that we do not need to check. PMD: Dr. Iona Beard at [facility] primary care  Previous records reviewed. Patient was seen here in March 2017 for chest pain, thought to have costochondritis, and has been seen by her primary care physician in November for atypica; chest pain. She was referred to cardiology, states that she has not been able to see them because she is "waiting for her insurance card;"  States that today's pain is  different than previous pain in that it is more intense than usual. She has never had an ER workup for this chest pain.  Pt had a negative echo stress test in 11 2015 notable for mild LVH with an EF of 55%.   Past Medical History:  Diagnosis Date  . Anxiety state, unspecified   . Asthma   . Ectopic pregnancy   . Essential hypertension, benign   . Other and unspecified ovarian cyst   . Other disorder of menstruation and other abnormal bleeding from female genital tract     Past Surgical History:  Procedure Laterality Date  . ECTOPIC PREGNANCY SURGERY      Family History  Problem Relation Age of Onset  . Hypertension Mother   . Hypertension Father   . Hypertension Sister   . Hypertension Maternal Grandmother   . Heart disease Maternal Grandfather   . Hypertension Sister     Social History  Substance Use Topics  . Smoking status: Current Every Day Smoker    Packs/day: 2.00    Years: 10.00    Types: Cigarettes  . Smokeless tobacco: Never Used  . Alcohol use Yes     Comment: socially    No current facility-administered medications for this encounter.   Current Outpatient Prescriptions:  .  clonazePAM (KLONOPIN) 0.5 MG tablet, Take 0.5 mg by mouth 2 (two) times daily as needed for anxiety., Disp: , Rfl:  .  Multiple Vitamin (MULTIVITAMIN) tablet, Take 1 tablet by mouth daily., Disp: , Rfl:  .  verapamil (CALAN-SR) 180 MG CR tablet, Take 180 mg by mouth at bedtime., Disp: , Rfl:  .  ALPRAZolam (XANAX) 0.5 MG tablet, Take 0.5 mg  by mouth at bedtime as needed., Disp: , Rfl:  .  hydrochlorothiazide (MICROZIDE) 12.5 MG capsule, Take 12.5 mg by mouth daily., Disp: , Rfl:  .  metoprolol tartrate (LOPRESSOR) 12.5 mg TABS, Take 12.5 mg by mouth 2 (two) times daily., Disp: , Rfl:   No Known Allergies   ROS  As noted in HPI.   Physical Exam  BP (!) 143/100   Pulse 74   Temp 97.9 F (36.6 C) (Oral)   Resp 16   Ht 5\' 5"  (1.651 m)   Wt 148 lb (67.1 kg)   LMP 03/24/2016    SpO2 100%   BMI 24.63 kg/m   Constitutional: Well developed, well nourished, no acute distress Eyes: PERRL, EOMI, conjunctiva normal bilaterally HENT: Normocephalic, atraumatic,mucus membranes moist Respiratory: Clear to auscultation bilaterally, no rales, no wheezing, no rhonchi. No chest wall tenderness. No pain with torso rotation. Cardiovascular: Normal rate and rhythm, no murmurs, no gallops, no rubs. No rub with sitting forward and with exhalation.  GI: Soft, nondistended, normal bowel sounds, nontender, no rebound, no guarding no palpable mass skin: No rash, skin intact  Musculoskeletal: Calves symmetric, nontender, No edema, no tenderness, no deformities No back tenderness Neurologic: Alert & oriented x 3, CN II-XII grossly intact, no motor deficits, sensation grossly intact Psychiatric: Speech and behavior appropriate   ED Course   Medications  aspirin chewable tablet 324 mg (324 mg Oral Given 03/31/16 1634)    Orders Placed This Encounter  Procedures  . ED EKG    Standing Status:   Standing    Number of Occurrences:   1    Order Specific Question:   Reason for Exam    Answer:   Chest Pain  . EKG 12-Lead    Standing Status:   Standing    Number of Occurrences:   1   No results found for this or any previous visit (from the past 24 hour(s)). No results found.  ED Clinical Impression  Chest pain, unspecified type   ED Assessment/Plan  EKG: Normal sinus rhythm, rate 70. Left axis deviation. Normal intervals. No hypertrophy. No ST-T wave changes . No changes from EKG on 07/2015.   outside notes reviewed. As noted in HPI.    Transferring to the [facility] ED as this is not definitively musculoskeletal or GERD and patient does have several cardiac risk factors. She has a normal EKG, normal vitals and a reassuring exam, feel that she is stable to go by private vehicle. Given aspirin 324 mg by mouth 1 here. Advised patient to go immediately to the [facility] ED. She  agrees with plan. Notified ED.    Meds ordered this encounter  Medications  . aspirin chewable tablet 324 mg    *This clinic note was created using [employer]. Therefore, there may be occasional mistakes despite careful proofreading.  ?   Melynda Ripple, MD 03/31/16 [phone]

## 2016-04-15 ENCOUNTER — Inpatient Hospital Stay
Admission: EM | Admit: 2016-04-15 | Discharge: 2016-04-18 | DRG: 761 | Disposition: A | Discharge status: Home / Self Care | Payer: BLUE CROSS/BLUE SHIELD | Attending: Obstetrics and Gynecology | Admitting: Obstetrics and Gynecology

## 2016-04-15 ENCOUNTER — Emergency Department: Payer: BLUE CROSS/BLUE SHIELD

## 2016-04-15 DIAGNOSIS — D649 Anemia, unspecified: Secondary | ICD-10-CM | POA: Diagnosis present

## 2016-04-15 DIAGNOSIS — I10 Essential (primary) hypertension: Secondary | ICD-10-CM | POA: Diagnosis present

## 2016-04-15 DIAGNOSIS — J45909 Unspecified asthma, uncomplicated: Secondary | ICD-10-CM | POA: Diagnosis present

## 2016-04-15 DIAGNOSIS — F17210 Nicotine dependence, cigarettes, uncomplicated: Secondary | ICD-10-CM | POA: Diagnosis present

## 2016-04-15 DIAGNOSIS — N939 Abnormal uterine and vaginal bleeding, unspecified: Secondary | ICD-10-CM

## 2016-04-15 DIAGNOSIS — D62 Acute posthemorrhagic anemia: Secondary | ICD-10-CM

## 2016-04-15 DIAGNOSIS — N838 Other noninflammatory disorders of ovary, fallopian tube and broad ligament: Secondary | ICD-10-CM

## 2016-04-15 DIAGNOSIS — N920 Excessive and frequent menstruation with regular cycle: Secondary | ICD-10-CM | POA: Diagnosis not present

## 2016-04-15 DIAGNOSIS — D500 Iron deficiency anemia secondary to blood loss (chronic): Secondary | ICD-10-CM | POA: Diagnosis present

## 2016-04-15 LAB — CBC WITH DIFFERENTIAL/PLATELET
BASOS ABS: 0 10*3/uL (ref 0–0.1)
Basophils Absolute: 0 10*3/uL (ref 0–0.1)
Basophils Relative: 1 %
Basophils Relative: 0 %
EOS ABS: 0 10*3/uL (ref 0–0.7)
EOS PCT: 0 %
Eosinophils Absolute: 0 10*3/uL (ref 0–0.7)
Eosinophils Relative: 0 %
HCT: 22.8 % — ABNORMAL LOW (ref 35.0–47.0)
HEMATOCRIT: 26.8 % — AB (ref 35.0–47.0)
HEMOGLOBIN: 9 g/dL — AB (ref 12.0–16.0)
Hemoglobin: 7.7 g/dL — ABNORMAL LOW (ref 12.0–16.0)
LYMPHS ABS: 1.6 10*3/uL (ref 1.0–3.6)
LYMPHS PCT: 17 %
Lymphocytes Relative: 15 %
Lymphs Abs: 1.3 10*3/uL (ref 1.0–3.6)
MCH: 30 pg (ref 26.0–34.0)
MCH: 30.2 pg (ref 26.0–34.0)
MCHC: 33.5 g/dL (ref 32.0–36.0)
MCHC: 33.8 g/dL (ref 32.0–36.0)
MCV: 89.5 fL (ref 80.0–100.0)
MCV: 89.6 fL (ref 80.0–100.0)
MONO ABS: 0.8 10*3/uL (ref 0.2–0.9)
MONOS PCT: 6 %
Monocytes Absolute: 0.5 10*3/uL (ref 0.2–0.9)
Monocytes Relative: 9 %
NEUTROS ABS: 7.3 10*3/uL — AB (ref 1.4–6.5)
NEUTROS PCT: 76 %
Neutro Abs: 6.6 10*3/uL — ABNORMAL HIGH (ref 1.4–6.5)
Neutrophils Relative %: 76 %
PLATELETS: 196 10*3/uL (ref 150–440)
Platelets: 216 10*3/uL (ref 150–440)
RBC: 2.99 MIL/uL — AB (ref 3.80–5.20)
RBC: 2.54 MIL/uL — AB (ref 3.80–5.20)
RDW: 13.6 % (ref 11.5–14.5)
RDW: 13.7 % (ref 11.5–14.5)
WBC: 9.5 10*3/uL (ref 3.6–11.0)
WBC: 8.7 10*3/uL (ref 3.6–11.0)

## 2016-04-15 LAB — BASIC METABOLIC PANEL
Anion gap: 3 — ABNORMAL LOW (ref 5–15)
BUN: 12 mg/dL (ref 6–20)
CHLORIDE: 109 mmol/L (ref 101–111)
CO2: 27 mmol/L (ref 22–32)
CREATININE: 0.84 mg/dL (ref 0.44–1.00)
Calcium: 8.1 mg/dL — ABNORMAL LOW (ref 8.9–10.3)
GFR calc non Af Amer: >60 mL/min (ref >60)
Glucose, Bld: 115 mg/dL — ABNORMAL HIGH (ref 65–99)
Potassium: 3.3 mmol/L — ABNORMAL LOW (ref 3.5–5.1)
SODIUM: 139 mmol/L (ref 135–145)

## 2016-04-15 LAB — CBC
HCT: 28.8 % — ABNORMAL LOW (ref 35.0–47.0)
HCT: 24.2 % — ABNORMAL LOW (ref 35.0–47.0)
HEMOGLOBIN: 9.7 g/dL — AB (ref 12.0–16.0)
Hemoglobin: 8.2 g/dL — ABNORMAL LOW (ref 12.0–16.0)
MCH: 30 pg (ref 26.0–34.0)
MCH: 30.2 pg (ref 26.0–34.0)
MCHC: 33.6 g/dL (ref 32.0–36.0)
MCHC: 34.1 g/dL (ref 32.0–36.0)
MCV: 89.4 fL (ref 80.0–100.0)
MCV: 88.6 fL (ref 80.0–100.0)
PLATELETS: 191 10*3/uL (ref 150–440)
Platelets: 238 10*3/uL (ref 150–440)
RBC: 3.22 MIL/uL — AB (ref 3.80–5.20)
RBC: 2.73 MIL/uL — AB (ref 3.80–5.20)
RDW: 13.8 % (ref 11.5–14.5)
RDW: 14.3 % (ref 11.5–14.5)
WBC: 10.2 10*3/uL (ref 3.6–11.0)
WBC: 14 10*3/uL — AB (ref 3.6–11.0)

## 2016-04-15 LAB — ABO/RH: ABO/RH(D): O POS

## 2016-04-15 LAB — HCG, QUANTITATIVE, PREGNANCY: hCG, Beta Chain, Quant, S: <1 m[IU]/mL (ref <5)

## 2016-04-15 LAB — PREPARE RBC (CROSSMATCH)

## 2016-04-15 MED ORDER — FERROUS SULFATE 325 (65 FE) MG PO TABS
325.0000 mg | ORAL_TABLET | Freq: Three times a day (TID) | ORAL | Status: DC
  Administered 2016-04-15 – 2016-04-18 (×8): 325 mg via ORAL
  Filled 2016-04-15 (×8): qty 1

## 2016-04-15 MED ORDER — IBUPROFEN 600 MG PO TABS
600.0000 mg | ORAL_TABLET | Freq: Four times a day (QID) | ORAL | Status: DC | PRN
  Administered 2016-04-16 – 2016-04-17 (×3): 600 mg via ORAL
  Filled 2016-04-15 (×6): qty 1

## 2016-04-15 MED ORDER — SODIUM CHLORIDE 0.9 % IV SOLN
Freq: Once | INTRAVENOUS | Status: AC
  Administered 2016-04-15: 15:00:00 via INTRAVENOUS

## 2016-04-15 MED ORDER — HYDROCODONE-ACETAMINOPHEN 5-325 MG PO TABS
1.0000 | ORAL_TABLET | ORAL | Status: DC | PRN
  Administered 2016-04-15: 1 via ORAL
  Filled 2016-04-15: qty 1

## 2016-04-15 MED ORDER — PRENATAL MULTIVITAMIN CH
1.0000 | ORAL_TABLET | Freq: Every day | ORAL | Status: DC
  Administered 2016-04-16 – 2016-04-17 (×2): 1 via ORAL
  Filled 2016-04-15 (×2): qty 1

## 2016-04-15 MED ORDER — SODIUM CHLORIDE 0.9 % IV BOLUS (SEPSIS)
1000.0000 mL | Freq: Once | INTRAVENOUS | Status: AC
  Administered 2016-04-15: 1000 mL via INTRAVENOUS

## 2016-04-15 MED ORDER — AMMONIA AROMATIC IN INHA
RESPIRATORY_TRACT | Status: AC
  Filled 2016-04-15: qty 10

## 2016-04-15 MED ORDER — MEDROXYPROGESTERONE ACETATE 10 MG PO TABS
20.0000 mg | ORAL_TABLET | Freq: Every day | ORAL | Status: DC
  Administered 2016-04-15: 20 mg via ORAL
  Filled 2016-04-15: qty 2

## 2016-04-15 MED ORDER — ONDANSETRON HCL 4 MG/2ML IJ SOLN: 4.0000 mg | Freq: Four times a day (QID) | INTRAMUSCULAR | Status: DC | PRN

## 2016-04-15 MED ORDER — SODIUM CHLORIDE 0.9 % IV SOLN
Freq: Once | INTRAVENOUS | Status: AC
  Administered 2016-04-17: 15:00:00 via INTRAVENOUS

## 2016-04-15 MED ORDER — SODIUM CHLORIDE 0.9 % IV SOLN
INTRAVENOUS | Status: DC
  Administered 2016-04-15 – 2016-04-17 (×7): via INTRAVENOUS

## 2016-04-15 MED ORDER — MEDROXYPROGESTERONE ACETATE 10 MG PO TABS: 20.0000 mg | ORAL_TABLET | Freq: Three times a day (TID) | ORAL | 0 refills | Status: DC

## 2016-04-15 MED ORDER — MEDROXYPROGESTERONE ACETATE 10 MG PO TABS
20.0000 mg | ORAL_TABLET | Freq: Three times a day (TID) | ORAL | Status: DC
  Administered 2016-04-15 – 2016-04-18 (×8): 20 mg via ORAL
  Filled 2016-04-15 (×8): qty 2

## 2016-04-15 MED ORDER — ONDANSETRON HCL 4 MG PO TABS: 4.0000 mg | ORAL_TABLET | Freq: Four times a day (QID) | ORAL | Status: DC | PRN

## 2016-04-15 MED ORDER — VERAPAMIL HCL ER 180 MG PO TBCR
180.0000 mg | EXTENDED_RELEASE_TABLET | Freq: Every day | ORAL | Status: DC
  Administered 2016-04-17: 180 mg via ORAL
  Filled 2016-04-15 (×3): qty 1

## 2016-04-15 NOTE — ED Notes (Signed)
This RN assisted EDP with pelvic exam, noted several baseball sized clots to come from patient's vagina.  Pt states that this is not her normal period.

## 2016-04-15 NOTE — Progress Notes (Signed)
CNA assisted pt to the bathroom.  On the way back to the bed, the patient pallor went white and CNA assisted her to sit on the bed while calling for help from nursing staff.  RN arrived and patient had eyes open but not focused and was not responding at first to voice and commands.  Patient was laid down, vs obtained and patient began to respond with return of color.  First unit of pRBCs started @ 1521, RN at bedside.

## 2016-04-15 NOTE — H&P (Signed)
Obstetrics & Gynecology Consult H&P    Consulting Department: Emergency Department  Consulting Physician: Lenise Arena, MD  Consulting Question: Menorrhagia   History of Present Illness: Patient is a 56 y.o. [phone] presenting after a 9 month period of amenorrhea with menorrhagia.  She reports bleeding starting on 04/11/2016, initially light then began increasing in intensity on 04/13/2016.  She reports passage of silver dollar size blood clots, minimal if any cramping.  Changing pads every hour.  She does feel light headed and dizzy.  She denies prior history of menorrhagia.  No family or personal history of bleeding disorders.  She has received a prior blood transfusion with surgery for a ruptured ectopic and is missing her right ovary and tube.    Review of Systems:10 point review of systems  Past Medical History:  Past Medical History:  Diagnosis Date  . Anxiety state, unspecified   . Asthma   . Ectopic pregnancy   . Essential hypertension, benign   . Other and unspecified ovarian cyst   . Other disorder of menstruation and other abnormal bleeding from female genital tract     Past Surgical History:  Past Surgical History:  Procedure Laterality Date  . ECTOPIC PREGNANCY SURGERY      Gynecologic History: No history of abnormal paps, denies history of STI  Obstetric History: [id number], TSVD x 3, ectopic x 1  Family History:  Family History  Problem Relation Age of Onset  . Hypertension Mother   . Hypertension Father   . Hypertension Sister   . Hypertension Maternal Grandmother   . Heart disease Maternal Grandfather   . Hypertension Sister     Social History:  Social History   Social History  . Marital status: Single    Spouse name: N/A  . Number of children: N/A  . Years of education: N/A   Occupational History  . Not on file.   Social History Main Topics  . Smoking status: Current Every Day Smoker    Packs/day: 2.00    Years: 10.00    Types:  Cigarettes  . Smokeless tobacco: Never Used  . Alcohol use Yes     Comment: socially  . Drug use: No  . Sexual activity: Not on file   Other Topics Concern  . Not on file   Social History Narrative  . No narrative on file    Allergies:  Allergies  Allergen Reactions  . Codeine Nausea And Vomiting    Medications: Prior to Admission medications   Medication Sig Start Date End Date Taking? Authorizing Provider  Aspirin-Salicylamide-Caffeine (BC HEADACHE POWDER PO) Take 1 packet by mouth daily as needed.   Yes Historical Provider, MD  clonazePAM (KLONOPIN) 0.5 MG tablet Take 0.5 mg by mouth 2 (two) times daily as needed for anxiety.   Yes Historical Provider, MD  Iron-Vitamins (GERITOL PO) Take 15 mLs by mouth daily.   Yes Historical Provider, MD  ketotifen (ZADITOR) 0.025 % ophthalmic solution Place 1 drop into both eyes 2 (two) times daily as needed.   Yes Historical Provider, MD  Multiple Vitamin (MULTIVITAMIN) tablet Take 1 tablet by mouth daily.   Yes Historical Provider, MD  traMADol (ULTRAM) 50 MG tablet Take 1 tablet (50 mg total) by mouth every 6 (six) hours as needed. 03/31/16  Yes Harvest Dark, MD  verapamil (CALAN-SR) 180 MG CR tablet Take 180 mg by mouth at bedtime.   Yes Historical Provider, MD  medroxyPROGESTERone (PROVERA) 10 MG tablet Take 2 tablets (20 mg total) by  mouth 3 (three) times daily. 04/15/16   Earleen Newport, MD    Physical Exam Vitals: Blood pressure 104/74, pulse 98, temperature 98.2 F (36.8 C), temperature source Oral, resp. rate 16, height 5' 5" (1.651 m), weight 148 lb (67.1 kg), last menstrual period 03/24/2016, SpO2 98 %. General: NAD HEENT: normocephalic, anicteric, conjuctiva pale Pulmonary: CTAB Cardiovascular: RRR Abdomen: gravid, non-tender Genitourinary: deferred Extremities: no edema Neurologic: grossly intact Psychiatric: mood appropriate, affect full  Labs: Results for orders placed or performed during the hospital  encounter of 04/15/16 (from the past 72 hour(s))  CBC     Status: Abnormal   Collection Time: 04/15/16  6:38 AM  Result Value Ref Range   WBC 10.2 3.6 - 11.0 K/uL   RBC 3.22 (L) 3.80 - 5.20 MIL/uL   Hemoglobin 9.7 (L) 12.0 - 16.0 g/dL   HCT 28.8 (L) 35.0 - 47.0 %   MCV 89.4 80.0 - 100.0 fL   MCH 30.0 26.0 - 34.0 pg   MCHC 33.6 32.0 - 36.0 g/dL   RDW 13.8 11.5 - 14.5 %   Platelets 238 150 - 440 K/uL  Basic metabolic panel     Status: Abnormal   Collection Time: 04/15/16  6:38 AM  Result Value Ref Range   Sodium 139 135 - 145 mmol/L   Potassium 3.3 (L) 3.5 - 5.1 mmol/L   Chloride 109 101 - 111 mmol/L   CO2 27 22 - 32 mmol/L   Glucose, Bld 115 (H) 65 - 99 mg/dL   BUN 12 6 - 20 mg/dL   Creatinine, Ser 0.84 0.44 - 1.00 mg/dL   Calcium 8.1 (L) 8.9 - 10.3 mg/dL   GFR calc non Af Amer >60 >60 mL/min   GFR calc Af Amer >60 >60 mL/min    Comment: (NOTE) The eGFR has been calculated using the CKD EPI equation. This calculation has not been validated in all clinical situations. eGFR's persistently <60 mL/min signify possible Chronic Kidney Disease.    Anion gap 3 (L) 5 - 15  Type and screen [facility]     Status: None   Collection Time: 04/15/16  6:38 AM  Result Value Ref Range   ABO/RH(D) O POS    Antibody Screen NEG    Sample Expiration 04/18/2016   hCG, quantitative, pregnancy     Status: None   Collection Time: 04/15/16  6:38 AM  Result Value Ref Range   hCG, Beta Chain, Quant, S <1 <5 mIU/mL    Comment:          GEST. AGE      CONC.  (mIU/mL)   <=1 WEEK        5 - 50     2 WEEKS       50 - 500     3 WEEKS       100 - 10,000     4 WEEKS     1,000 - 30,000     5 WEEKS     3,500 - 115,000   6-8 WEEKS     12,000 - 270,000    12 WEEKS     15,000 - 220,000        FEMALE AND NON-PREGNANT FEMALE:     LESS THAN 5 mIU/mL   CBC with Differential/Platelet     Status: Abnormal   Collection Time: 04/15/16  8:54 AM  Result Value Ref Range   WBC 9.5 3.6 - 11.0  K/uL   RBC 2.99 (L)  3.80 - 5.20 MIL/uL   Hemoglobin 9.0 (L) 12.0 - 16.0 g/dL   HCT 26.8 (L) 35.0 - 47.0 %   MCV 89.5 80.0 - 100.0 fL   MCH 30.0 26.0 - 34.0 pg   MCHC 33.5 32.0 - 36.0 g/dL   RDW 13.6 11.5 - 14.5 %   Platelets 216 150 - 440 K/uL   Neutrophils Relative % 76 %   Neutro Abs 7.3 (H) 1.4 - 6.5 K/uL   Lymphocytes Relative 17 %   Lymphs Abs 1.6 1.0 - 3.6 K/uL   Monocytes Relative 6 %   Monocytes Absolute 0.5 0.2 - 0.9 K/uL   Eosinophils Relative 0 %   Eosinophils Absolute 0.0 0 - 0.7 K/uL   Basophils Relative 1 %   Basophils Absolute 0.0 0 - 0.1 K/uL    Imaging Dg Chest 2 View  Result Date: 03/31/2016 CLINICAL DATA:  Anterior chest pain for 3 days, history asthma, hypertension EXAM: CHEST  2 VIEW COMPARISON:  06/15/2015 FINDINGS: Normal heart size, mediastinal contours, and pulmonary vascularity. Lungs clear. No pleural effusion or pneumothorax. Bones unremarkable. IMPRESSION: Normal exam. Electronically Signed   By: Lavonia Dana M.D.   On: 03/31/2016 18:14   US Transvaginal Non-ob  Result Date: 04/15/2016 CLINICAL DATA:  Vaginal bleeding EXAM: TRANSABDOMINAL AND TRANSVAGINAL ULTRASOUND OF PELVIS TECHNIQUE: Study was performed transabdominally to optimize pelvic field of view evaluation and transvaginally to optimize internal visceral architecture evaluation. COMPARISON:  CT abdomen and pelvis Aug 23, 2009 FINDINGS: Uterus Measurements: 9.6 x 5.1 x 6.0 cm. No fibroids or other mass visualized. Endometrium Thickness: 4 mm. A small amount of fluid, likely hemorrhage, is seen in the endometrium. The contour of the endometrium is smooth. Right ovary Not visualized by transabdominal and transvaginal technique. No mass seen in the right pelvic region. Left ovary Measurements: 6.2 x 4.2 x 7.2 cm. There is a complex partially cystic mass which contains an area of noncystic material. This lesion measures 8.3 x 4.1 x 7.8 cm. The solid component within this lesion measures 6.8 x 3.5 x 6.0 cm.  Immediately adjacent to this larger lesion, there is a somewhat complex predominantly cystic lesion which contains areas of apparent nodularity measuring 3.7 x 3.0 x 3.8 cm. Smaller predominantly cystic areas arise from the left ovary as well. Other findings Small amount of free pelvic fluid. IMPRESSION: Complex left ovarian masses, primarily cystic but with solid components as well as areas of thickened septation. 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 Right ovary could not be visualized by transabdominal or transvaginal technique. No mass to the right of midline evident. Note that the largest mass which appears to arise from the left ovary eccentrically extends toward the midline near the uterus. Small amount of free pelvic fluid. Question recent ovarian cyst rupture or leakage. Small amount of apparent hemorrhage within the endometrium. Endometrium is not thickened. Given concern for potential ovarian neoplasm, CT of the abdomen and pelvis following oral and intravenous contrast advised to further evaluate the pelvis as well as to assess for potential metastatic foci. Gynecologic consultation also advised. Electronically Signed   By: Lowella Grip III M.D.   On: 04/15/2016 09:26   US Pelvis Complete  Result Date: 04/15/2016 CLINICAL DATA:  Vaginal bleeding EXAM: TRANSABDOMINAL AND TRANSVAGINAL ULTRASOUND OF PELVIS TECHNIQUE: Study was performed transabdominally to optimize pelvic field of view evaluation and transvaginally to optimize internal visceral architecture evaluation. COMPARISON:  CT abdomen and pelvis Aug 23, 2009 FINDINGS: Uterus Measurements: 9.6 x 5.1 x 6.0 cm. No fibroids or other mass visualized. Endometrium Thickness: 4 mm. A small amount of fluid, likely hemorrhage, is seen in the endometrium. The contour of the endometrium is smooth. Right ovary Not visualized by transabdominal and transvaginal technique. No mass seen  in the right pelvic region. Left ovary Measurements: 6.2 x 4.2 x 7.2 cm. There is a complex partially cystic mass which contains an area of noncystic material. This lesion measures 8.3 x 4.1 x 7.8 cm. The solid component within this lesion measures 6.8 x 3.5 x 6.0 cm. Immediately adjacent to this larger lesion, there is a somewhat complex predominantly cystic lesion which contains areas of apparent nodularity measuring 3.7 x 3.0 x 3.8 cm. Smaller predominantly cystic areas arise from the left ovary as well. Other findings Small amount of free pelvic fluid. IMPRESSION: Complex left ovarian masses, primarily cystic but with solid components as well as areas of thickened septation. 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 Right ovary could not be visualized by transabdominal or transvaginal technique. No mass to the right of midline evident. Note that the largest mass which appears to arise from the left ovary eccentrically extends toward the midline near the uterus. Small amount of free pelvic fluid. Question recent ovarian cyst rupture or leakage. Small amount of apparent hemorrhage within the endometrium. Endometrium is not thickened. Given concern for potential ovarian neoplasm, CT of the abdomen and pelvis following oral and intravenous contrast advised to further evaluate the pelvis as well as to assess for potential metastatic foci. Gynecologic consultation also advised. Electronically Signed   By: Lowella Grip III M.D.   On: 04/15/2016 09:26    Assessment: 56 y.o. with menorrhagia to anemia  Plan: 1) Menorrhagia - no apparent structural lesion based on ultrasound such as fibroid or polyp.  Will need outpatient endometrial biopsy.  Currently Hgb is above transfusion threshold but patient reports being symptomatic with ambulation.  Will start on provera 73m tid, repeat CBC this afternoon.  Will monitor for improvement in bleeding  and stable H&H.    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

## 2016-04-15 NOTE — ED Triage Notes (Signed)
Pt also reports dizziness from losing blood and repeatedly saying "I know I need a blood transfusion." Pt doesn't recall last well-woman's exam.

## 2016-04-15 NOTE — ED Provider Notes (Addendum)
Patient discussed with Dr. Georgianne Fick with GYN who will see the patient in the morning in the office 8:00. She's been started on high-dose Provera just 20 mg 3 times a day.   After period of observation the bleeding worsened, she feels near syncopal. I will discuss with GYN for admission.   Earleen Newport, MD 04/15/16 1002    Earleen Newport, MD 04/15/16 [phone]

## 2016-04-15 NOTE — ED Triage Notes (Signed)
Pt reports period started on Sunday.  Pt reports in past hour that she has gone through 3-4 overnight sanitary pads.  Pt states she is not taking blood thinners, but does take BC powder for pain regularly.

## 2016-04-15 NOTE — ED Notes (Signed)
Patient transported to Ultrasound 

## 2016-04-15 NOTE — ED Provider Notes (Signed)
[facility] Emergency Department Provider Note        Time seen: ----------------------------------------- 7:19 AM on 04/15/2016 -----------------------------------------    I have reviewed the triage vital signs and the nursing notes.   HISTORY  Chief Complaint Vaginal Bleeding    HPI Vertis Valliant is a 56 y.o. female who presents to ER for heavy vaginal bleeding. Patient reports dizziness, states she's had heavy vaginal bleeding for the last 24-48 hours. She is reports her period started on Sunday again. She reports in the past hour she has gone through 3-4 overnight sedentary past. Patient states she is not taking blood thinner, reports she does not have heavy bleeding normally. She denies fevers or chills, denies abdominal pain.   Past Medical History:  Diagnosis Date  . Anxiety state, unspecified   . Asthma   . Ectopic pregnancy   . Essential hypertension, benign   . Other and unspecified ovarian cyst   . Other disorder of menstruation and other abnormal bleeding from female genital tract     Patient Active Problem List   Diagnosis Date Noted  . Palpitations 01/04/2012    Past Surgical History:  Procedure Laterality Date  . ECTOPIC PREGNANCY SURGERY      Allergies Codeine  Social History Social History  Substance Use Topics  . Smoking status: Current Every Day Smoker    Packs/day: 2.00    Years: 10.00    Types: Cigarettes  . Smokeless tobacco: Never Used  . Alcohol use Yes     Comment: socially    Review of Systems Constitutional: Negative for fever. Cardiovascular: Negative for chest pain. Respiratory: Negative for shortness of breath. Gastrointestinal: Negative for abdominal pain, vomiting and diarrhea. Genitourinary: Negative for dysuria.Positive for heavy vaginal bleeding Musculoskeletal: Negative for back pain. Skin: Negative for rash. Neurological: Negative for headaches, focal weakness or  numbness.  10-point ROS otherwise negative.  ____________________________________________   PHYSICAL EXAM:  VITAL SIGNS: ED Triage Vitals [04/15/16 0636]  Enc Vitals Group     BP 138/85     Pulse Rate 100     Resp 18     Temp 98.2 F (36.8 C)     Temp Source Oral     SpO2 100 %     Weight 148 lb (67.1 kg)     Height 5\' 5"  (1.651 m)     Head Circumference      Peak Flow      Pain Score      Pain Loc      Pain Edu?      Excl. in [place]?    Constitutional: Alert and oriented. Well appearing and in no distress. Eyes: Conjunctivae are normal. PERRL. Normal extraocular movements. ENT   Head: Normocephalic and atraumatic.   Nose: No congestion/rhinnorhea.   Mouth/Throat: Mucous membranes are moist.   Neck: No stridor. Cardiovascular: Normal rate, regular rhythm. No murmurs, rubs, or gallops. Respiratory: Normal respiratory effort without tachypnea nor retractions. Breath sounds are clear and equal bilaterally. No wheezes/rales/rhonchi. Gastrointestinal: Soft and nontender. Normal bowel sounds Genitourinary: Patient passing large clots, no brisk arterial bleeding. Musculoskeletal: Nontender with normal range of motion in all extremities. No lower extremity tenderness nor edema. Neurologic:  Normal speech and language. No gross focal neurologic deficits are appreciated.  Skin:  Skin is warm, dry and intact. No rash noted. Psychiatric: Mood and affect are normal. Speech and behavior are normal.  ___________________________________________  ED COURSE:  Pertinent labs & imaging results that were available during  my care of the patient were reviewed by me and considered in my medical decision making (see chart for details). Clinical Course   Patient is in no distress, we will assess with labs and possibly ultrasound imaging.  Procedures ____________________________________________   LABS (pertinent positives/negatives)  Labs Reviewed  CBC - Abnormal; Notable for  the following:       Result Value   RBC 3.22 (*)    Hemoglobin 9.7 (*)    HCT 28.8 (*)    All other components within normal limits  BASIC METABOLIC PANEL - Abnormal; Notable for the following:    Potassium 3.3 (*)    Glucose, Bld 115 (*)    Calcium 8.1 (*)    Anion gap 3 (*)    All other components within normal limits  CBC WITH DIFFERENTIAL/PLATELET - Abnormal; Notable for the following:    RBC 2.99 (*)    Hemoglobin 9.0 (*)    HCT 26.8 (*)    Neutro Abs 7.3 (*)    All other components within normal limits  HCG, QUANTITATIVE, PREGNANCY  TYPE AND SCREEN   CRITICAL CARE Performed by: Earleen Newport   Total critical care time: 30 minutes  Critical care time was exclusive of separately billable procedures and treating other patients.  Critical care was necessary to treat or prevent imminent or life-threatening deterioration.  Critical care was time spent personally by me on the following activities: development of treatment plan with patient and/or surrogate as well as nursing, discussions with consultants, evaluation of patient's response to treatment, examination of patient, obtaining history from patient or surrogate, ordering and performing treatments and interventions, ordering and review of laboratory studies, ordering and review of radiographic studies, pulse oximetry and re-evaluation of patient's condition.  RADIOLOGY Images were viewed by me  Pelvic ultrasound IMPRESSION: Complex left ovarian masses, primarily cystic but with solid components as well as areas of thickened septation. 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  Right ovary could not be visualized by transabdominal or transvaginal technique. No mass to the right of midline evident. Note that the largest mass which appears to arise from the left ovary eccentrically extends toward the midline near the uterus.  Small amount of  free pelvic fluid. Question recent ovarian cyst rupture or leakage.  Small amount of apparent hemorrhage within the endometrium. Endometrium is not thickened.  Given concern for potential ovarian neoplasm, CT of the abdomen and pelvis following oral and intravenous contrast advised to further evaluate the pelvis as well as to assess for potential metastatic foci. Gynecologic consultation also advised. ____________________________________________   FINAL ASSESSMENT AND PLAN  Abnormal vaginal bleeding, Acute blood loss anemia, ovarian mass  Plan: Patient with labs and imaging as dictated above. Patient presented to the ER without pain but with heavy vaginal bleeding for several days. Reportedly she had just had her menstrual cycle 2 weeks ago. Ultrasound findings reveal possible ovarian mass with endometrial hemorrhage. I will discuss with OB/GYN for further evaluation.   Earleen Newport, MD   Note: This dictation was prepared with Dragon dictation. Any transcriptional errors that result from this process are unintentional    Earleen Newport, MD 04/15/16 [phone]

## 2016-04-15 NOTE — Progress Notes (Signed)
Results for orders placed or performed during the hospital encounter of 04/15/16 (from the past 24 hour(s))  CBC     Status: Abnormal   Collection Time: 04/15/16  6:38 AM  Result Value Ref Range   WBC 10.2 3.6 - 11.0 K/uL   RBC 3.22 (L) 3.80 - 5.20 MIL/uL   Hemoglobin 9.7 (L) 12.0 - 16.0 g/dL   HCT 28.8 (L) 35.0 - 47.0 %   MCV 89.4 80.0 - 100.0 fL   MCH 30.0 26.0 - 34.0 pg   MCHC 33.6 32.0 - 36.0 g/dL   RDW 13.8 11.5 - 14.5 %   Platelets 238 150 - 440 K/uL  Basic metabolic panel     Status: Abnormal   Collection Time: 04/15/16  6:38 AM  Result Value Ref Range   Sodium 139 135 - 145 mmol/L   Potassium 3.3 (L) 3.5 - 5.1 mmol/L   Chloride 109 101 - 111 mmol/L   CO2 27 22 - 32 mmol/L   Glucose, Bld 115 (H) 65 - 99 mg/dL   BUN 12 6 - 20 mg/dL   Creatinine, Ser 0.84 0.44 - 1.00 mg/dL   Calcium 8.1 (L) 8.9 - 10.3 mg/dL   GFR calc non Af Amer >60 >60 mL/min   GFR calc Af Amer >60 >60 mL/min   Anion gap 3 (L) 5 - 15  Type and screen [facility]     Status: None   Collection Time: 04/15/16  6:38 AM  Result Value Ref Range   ABO/RH(D) O POS    Antibody Screen NEG    Sample Expiration 04/18/2016   hCG, quantitative, pregnancy     Status: None   Collection Time: 04/15/16  6:38 AM  Result Value Ref Range   hCG, Beta Chain, Quant, S <1 <5 mIU/mL  CBC with Differential/Platelet     Status: Abnormal   Collection Time: 04/15/16  8:54 AM  Result Value Ref Range   WBC 9.5 3.6 - 11.0 K/uL   RBC 2.99 (L) 3.80 - 5.20 MIL/uL   Hemoglobin 9.0 (L) 12.0 - 16.0 g/dL   HCT 26.8 (L) 35.0 - 47.0 %   MCV 89.5 80.0 - 100.0 fL   MCH 30.0 26.0 - 34.0 pg   MCHC 33.5 32.0 - 36.0 g/dL   RDW 13.6 11.5 - 14.5 %   Platelets 216 150 - 440 K/uL   Neutrophils Relative % 76 %   Neutro Abs 7.3 (H) 1.4 - 6.5 K/uL   Lymphocytes Relative 17 %   Lymphs Abs 1.6 1.0 - 3.6 K/uL   Monocytes Relative 6 %   Monocytes Absolute 0.5 0.2 - 0.9 K/uL   Eosinophils Relative 0 %   Eosinophils Absolute  0.0 0 - 0.7 K/uL   Basophils Relative 1 %   Basophils Absolute 0.0 0 - 0.1 K/uL  CBC WITH DIFFERENTIAL     Status: Abnormal   Collection Time: 04/15/16  1:45 PM  Result Value Ref Range   WBC 8.7 3.6 - 11.0 K/uL   RBC 2.54 (L) 3.80 - 5.20 MIL/uL   Hemoglobin 7.7 (L) 12.0 - 16.0 g/dL   HCT 22.8 (L) 35.0 - 47.0 %   MCV 89.6 80.0 - 100.0 fL   MCH 30.2 26.0 - 34.0 pg   MCHC 33.8 32.0 - 36.0 g/dL   RDW 13.7 11.5 - 14.5 %   Platelets 196 150 - 440 K/uL   Neutrophils Relative % 76 %   Neutro Abs 6.6 (H) 1.4 -  6.5 K/uL   Lymphocytes Relative 15 %   Lymphs Abs 1.3 1.0 - 3.6 K/uL   Monocytes Relative 9 %   Monocytes Absolute 0.8 0.2 - 0.9 K/uL   Eosinophils Relative 0 %   Eosinophils Absolute 0.0 0 - 0.7 K/uL   Basophils Relative 0 %   Basophils Absolute 0.0 0 - 0.1 K/uL   Given continued drop and symptomatic will administer 1 units of pRBC recheck CBC 4-hr post transfusion

## 2016-04-15 NOTE — Progress Notes (Signed)
Obstetric and Gynecology  Subjective  Called by nursing staff for near syncopal event, pad weight 220grams, did pass some clots when going to the bathroom.  Patient still denies any significant cramping.  Will obtain repeat CBC early but anticipate will need second unit.  Objective   Vitals:   04/15/16 1536 04/15/16 1842  BP: 98/69 (!) 96/58  Pulse: (!) 110 (!) 112  Resp: 20 18  Temp: 98.3 F (36.8 C) 98.4 F (36.9 C)     Intake/Output Summary (Last 24 hours) at 04/15/16 1911 Last data filed at 04/15/16 1842  Gross per 24 hour  Intake              350 ml  Output              567 ml  Net             -217 ml    General: NAD Cardiovascular: Tachycardia Abdomen: soft, non-tender Genitor urinary: light to moderate bleeding on exam, normal cervix, small mobile uterus.   Extremities: no edema  Labs: Results for orders placed or performed during the hospital encounter of 04/15/16 (from the past 24 hour(s))  CBC     Status: Abnormal   Collection Time: 04/15/16  6:38 AM  Result Value Ref Range   WBC 10.2 3.6 - 11.0 K/uL   RBC 3.22 (L) 3.80 - 5.20 MIL/uL   Hemoglobin 9.7 (L) 12.0 - 16.0 g/dL   HCT 28.8 (L) 35.0 - 47.0 %   MCV 89.4 80.0 - 100.0 fL   MCH 30.0 26.0 - 34.0 pg   MCHC 33.6 32.0 - 36.0 g/dL   RDW 13.8 11.5 - 14.5 %   Platelets 238 150 - 440 K/uL  Basic metabolic panel     Status: Abnormal   Collection Time: 04/15/16  6:38 AM  Result Value Ref Range   Sodium 139 135 - 145 mmol/L   Potassium 3.3 (L) 3.5 - 5.1 mmol/L   Chloride 109 101 - 111 mmol/L   CO2 27 22 - 32 mmol/L   Glucose, Bld 115 (H) 65 - 99 mg/dL   BUN 12 6 - 20 mg/dL   Creatinine, Ser 0.84 0.44 - 1.00 mg/dL   Calcium 8.1 (L) 8.9 - 10.3 mg/dL   GFR calc non Af Amer >60 >60 mL/min   GFR calc Af Amer >60 >60 mL/min   Anion gap 3 (L) 5 - 15  Type and screen [facility]     Status: None (Preliminary result)   Collection Time: 04/15/16  6:38 AM  Result Value Ref Range   ISSUE DATE  / TIME [id number]    Blood Product Unit Number [id number]    PRODUCT CODE [id number]    Unit Type and Rh 9500    Blood Product Expiration Date RR:6164996    Blood Product Unit Number [id number]    Unit Type and Rh 5100    Blood Product Expiration Date VX:252403   hCG, quantitative, pregnancy     Status: None   Collection Time: 04/15/16  6:38 AM  Result Value Ref Range   hCG, Beta Chain, Quant, S <1 <5 mIU/mL  CBC with Differential/Platelet     Status: Abnormal   Collection Time: 04/15/16  8:54 AM  Result Value Ref Range   WBC 9.5 3.6 - 11.0 K/uL   RBC 2.99 (L) 3.80 - 5.20 MIL/uL   Hemoglobin 9.0 (L) 12.0 - 16.0 g/dL   HCT 26.8 (L) 35.0 - 47.0 %  MCV 89.5 80.0 - 100.0 fL   MCH 30.0 26.0 - 34.0 pg   MCHC 33.5 32.0 - 36.0 g/dL   RDW 13.6 11.5 - 14.5 %   Platelets 216 150 - 440 K/uL   Neutrophils Relative % 76 %   Neutro Abs 7.3 (H) 1.4 - 6.5 K/uL   Lymphocytes Relative 17 %   Lymphs Abs 1.6 1.0 - 3.6 K/uL   Monocytes Relative 6 %   Monocytes Absolute 0.5 0.2 - 0.9 K/uL   Eosinophils Relative 0 %   Eosinophils Absolute 0.0 0 - 0.7 K/uL   Basophils Relative 1 %   Basophils Absolute 0.0 0 - 0.1 K/uL  CBC WITH DIFFERENTIAL     Status: Abnormal   Collection Time: 04/15/16  1:45 PM  Result Value Ref Range   WBC 8.7 3.6 - 11.0 K/uL   RBC 2.54 (L) 3.80 - 5.20 MIL/uL   Hemoglobin 7.7 (L) 12.0 - 16.0 g/dL   HCT 22.8 (L) 35.0 - 47.0 %   MCV 89.6 80.0 - 100.0 fL   MCH 30.2 26.0 - 34.0 pg   MCHC 33.8 32.0 - 36.0 g/dL   RDW 13.7 11.5 - 14.5 %   Platelets 196 150 - 440 K/uL   Neutrophils Relative % 76 %   Neutro Abs 6.6 (H) 1.4 - 6.5 K/uL   Lymphocytes Relative 15 %   Lymphs Abs 1.3 1.0 - 3.6 K/uL   Monocytes Relative 9 %   Monocytes Absolute 0.8 0.2 - 0.9 K/uL   Eosinophils Relative 0 %   Eosinophils Absolute 0.0 0 - 0.7 K/uL   Basophils Relative 0 %   Basophils Absolute 0.0 0 - 0.1 K/uL  ABO/Rh     Status: None   Collection Time: 04/15/16  1:45 PM  Result Value  Ref Range   ABO/RH(D) O POS   Prepare RBC     Status: None   Collection Time: 04/15/16  3:00 PM  Result Value Ref Range   Order Confirmation ORDER PROCESSED BY BLOOD BANK   CBC     Status: Abnormal   Collection Time: 04/15/16  6:33 PM  Result Value Ref Range   WBC 14.0 (H) 3.6 - 11.0 K/uL   RBC 2.73 (L) 3.80 - 5.20 MIL/uL   Hemoglobin 8.2 (L) 12.0 - 16.0 g/dL   HCT 24.2 (L) 35.0 - 47.0 %   MCV 88.6 80.0 - 100.0 fL   MCH 30.2 26.0 - 34.0 pg   MCHC 34.1 32.0 - 36.0 g/dL   RDW 14.3 11.5 - 14.5 %   Platelets 191 150 - 440 K/uL    Cultures: No results found for this or any previous visit.  Imaging:  Assessment   56 y.o. with menorrhagia  Plan  1) Menorrhagia  - repeat CBC pending - 2nd unit of pRBC on standby - continue high dose provera, if continued bleeding after 3rd dose of provera consider adding tranaxemic acid - may make NPO after midnight in case patient requires D&C on emergent basis +/- ablation

## 2016-04-16 LAB — CBC
HCT: 23.7 % — ABNORMAL LOW (ref 35.0–47.0)
HEMOGLOBIN: 8.2 g/dL — AB (ref 12.0–16.0)
MCH: 29.9 pg (ref 26.0–34.0)
MCHC: 34.5 g/dL (ref 32.0–36.0)
MCV: 86.8 fL (ref 80.0–100.0)
Platelets: 142 10*3/uL — ABNORMAL LOW (ref 150–440)
RBC: 2.73 MIL/uL — ABNORMAL LOW (ref 3.80–5.20)
RDW: 14.4 % (ref 11.5–14.5)
WBC: 10.2 10*3/uL (ref 3.6–11.0)

## 2016-04-16 NOTE — Progress Notes (Signed)
Patient ID: Tanya Cline, female   DOB: October 01, 1960, 56 y.o.   MRN: [id number] Daily Benign Gynecology Progress Note Arlenys Kesler  [id number]  HD#2  Subjective:  Overnight Events: feels weak Complaints: continues to feel weak She denies: fevers, chills, chest pain, trouble breathing, nausea, vomiting, severe abdominal pain.  She has tolerated: normal diet as tolerated She is ambulating and is voiding.   Objective:  Most recent vitals Temp: 97.9 F (36.6 C)  BP: 113/67  Pulse Rate: 92  Resp: 16  SpO2: 100 %   Vitals Range over 24 hours Temp  Avg: 98.3 F (36.8 C)  Min: 97.6 F (36.4 C)  Max: 99.1 F (37.3 C) BP  Min: 93/79  Max: 116/69 Pulse  Avg: 100.8  Min: 84  Max: 119 SpO2  Avg: 99.9 %  Min: 99 %  Max: 100 %   Physical Exam General: alert, well appearing, and in no distress, appears pale Heart: regular rate and rhythm Lungs: clear to auscultation, no wheezes, rales or rhonchi, symmetric air entry Abdomen: abdomen is soft without significant tenderness, masses, organomegaly or guarding. Extremities: no redness or tenderness in the calves or thighs, no edema  AM Labs Lab Results  Component Value Date   WBC 10.2 04/16/2016   HGB 8.2 (L) 04/16/2016   HCT 23.7 (L) 04/16/2016   PLT 142 (L) 04/16/2016   NA 139 04/15/2016   K 3.3 (L) 04/15/2016   CREATININE 0.84 04/15/2016   BUN 12 04/15/2016     Assessment:  Tanya Cline is a 56 y.o. female HD@1  admitted with severe anemia due to vaginal bleeding. Her bleeding is improved, but still present like a menstrual bleed.   Plan:  Plan to monitor overnight. Repeat CBC in AM. If stable, will discharge tomorrow. Anticipated Discharge: tomorrow  Will Bonnet, MD  04/16/2016 5:20 PM

## 2016-04-17 DIAGNOSIS — D649 Anemia, unspecified: Secondary | ICD-10-CM | POA: Diagnosis present

## 2016-04-17 DIAGNOSIS — F17210 Nicotine dependence, cigarettes, uncomplicated: Secondary | ICD-10-CM | POA: Diagnosis present

## 2016-04-17 DIAGNOSIS — J45909 Unspecified asthma, uncomplicated: Secondary | ICD-10-CM | POA: Diagnosis present

## 2016-04-17 DIAGNOSIS — N920 Excessive and frequent menstruation with regular cycle: Secondary | ICD-10-CM | POA: Diagnosis present

## 2016-04-17 DIAGNOSIS — D500 Iron deficiency anemia secondary to blood loss (chronic): Secondary | ICD-10-CM | POA: Diagnosis present

## 2016-04-17 DIAGNOSIS — I10 Essential (primary) hypertension: Secondary | ICD-10-CM | POA: Diagnosis present

## 2016-04-17 LAB — CBC
HCT: 20.2 % — ABNORMAL LOW (ref 35.0–47.0)
HEMATOCRIT: 27.4 % — AB (ref 35.0–47.0)
HEMOGLOBIN: 7 g/dL — AB (ref 12.0–16.0)
HEMOGLOBIN: 9.5 g/dL — AB (ref 12.0–16.0)
MCH: 30.3 pg (ref 26.0–34.0)
MCH: 29.8 pg (ref 26.0–34.0)
MCHC: 34.8 g/dL (ref 32.0–36.0)
MCHC: 34.6 g/dL (ref 32.0–36.0)
MCV: 87 fL (ref 80.0–100.0)
MCV: 86.1 fL (ref 80.0–100.0)
Platelets: 164 10*3/uL (ref 150–440)
Platelets: 180 10*3/uL (ref 150–440)
RBC: 2.32 MIL/uL — AB (ref 3.80–5.20)
RBC: 3.18 MIL/uL — ABNORMAL LOW (ref 3.80–5.20)
RDW: 14.9 % — ABNORMAL HIGH (ref 11.5–14.5)
RDW: 14.9 % — ABNORMAL HIGH (ref 11.5–14.5)
WBC: 9.6 10*3/uL (ref 3.6–11.0)
WBC: 11 10*3/uL (ref 3.6–11.0)

## 2016-04-17 LAB — PREPARE RBC (CROSSMATCH)

## 2016-04-17 MED ORDER — TRANEXAMIC ACID 650 MG PO TABS
1300.0000 mg | ORAL_TABLET | Freq: Three times a day (TID) | ORAL | Status: DC
  Administered 2016-04-17 – 2016-04-18 (×3): 1300 mg via ORAL
  Filled 2016-04-17 (×4): qty 2

## 2016-04-17 MED ORDER — SODIUM CHLORIDE 0.9 % IV SOLN
Freq: Once | INTRAVENOUS | Status: AC
  Administered 2016-04-17: 12:00:00 via INTRAVENOUS

## 2016-04-17 NOTE — Progress Notes (Signed)
Obstetric and Gynecology  Subjective  Doing well, bleeding less but still feeling somewhat lightheaded and fatigued  Objective   Vitals:   04/17/16 0441 04/17/16 0751  BP: 111/67 107/69  Pulse: 97 92  Resp: 18 17  Temp: 98.1 F (36.7 C) 98.3 F (36.8 C)     Intake/Output Summary (Last 24 hours) at 04/17/16 1058 Last data filed at 04/17/16 1041  Gross per 24 hour  Intake             2400 ml  Output             1675 ml  Net              725 ml    General: NAD Cardiovascular: RRR Abdomen: soft, non-tender Extremities: no edema  Labs: Results for orders placed or performed during the hospital encounter of 04/15/16 (from the past 24 hour(s))  CBC     Status: Abnormal   Collection Time: 04/17/16  6:10 AM  Result Value Ref Range   WBC 9.6 3.6 - 11.0 K/uL   RBC 2.32 (L) 3.80 - 5.20 MIL/uL   Hemoglobin 7.0 (L) 12.0 - 16.0 g/dL   HCT 20.2 (L) 35.0 - 47.0 %   MCV 87.0 80.0 - 100.0 fL   MCH 30.3 26.0 - 34.0 pg   MCHC 34.8 32.0 - 36.0 g/dL   RDW 14.9 (H) 11.5 - 14.5 %   Platelets 164 150 - 440 K/uL    Cultures: No results found for this or any previous visit.  Imaging:  Assessment   55 y.o. with AUB-O, anemia  Plan    1) AUB-O - suspect bleeding secondary to perimenopausal state as uterus structurally normal on ultrasound.   - Continue provera - add lysteda - Additional 2 units of pRBC for symptomatic anemia  2) FEN - general diet  3) DVT ppx - ambulating

## 2016-04-17 NOTE — Progress Notes (Signed)
Patient ambulated in hall with nurse around m/b nurses station X 4.  Patient tolerated well; denies dizziness.

## 2016-04-18 LAB — CBC
HEMATOCRIT: 27.3 % — AB (ref 35.0–47.0)
Hemoglobin: 9.5 g/dL — ABNORMAL LOW (ref 12.0–16.0)
MCH: 30.1 pg (ref 26.0–34.0)
MCHC: 34.9 g/dL (ref 32.0–36.0)
MCV: 86.2 fL (ref 80.0–100.0)
PLATELETS: 181 10*3/uL (ref 150–440)
RBC: 3.16 MIL/uL — ABNORMAL LOW (ref 3.80–5.20)
RDW: 15.3 % — AB (ref 11.5–14.5)
WBC: 11.8 10*3/uL — ABNORMAL HIGH (ref 3.6–11.0)

## 2016-04-18 LAB — TYPE AND SCREEN
BLOOD PRODUCT EXPIRATION DATE: 201801132359
BLOOD PRODUCT EXPIRATION DATE: 201801242359
BLOOD PRODUCT EXPIRATION DATE: 201801242359
Blood Product Expiration Date: 201801232359
ISSUE DATE / TIME: 201801041507
ISSUE DATE / TIME: 201801041945
ISSUE DATE / TIME: 201801061141
ISSUE DATE / TIME: 201801061438
UNIT TYPE AND RH: 9500
Unit Type and Rh: 5100
Unit Type and Rh: 5100
Unit Type and Rh: 5100

## 2016-04-18 MED ORDER — TRANEXAMIC ACID 650 MG PO TABS: 1300.0000 mg | ORAL_TABLET | Freq: Three times a day (TID) | ORAL | 0 refills | Status: AC

## 2016-04-18 MED ORDER — FERROUS SULFATE 325 (65 FE) MG PO TABS: 325.0000 mg | ORAL_TABLET | Freq: Three times a day (TID) | ORAL | 3 refills | Status: DC

## 2016-04-18 MED ORDER — MEDROXYPROGESTERONE ACETATE 10 MG PO TABS: ORAL_TABLET | ORAL | Status: DC

## 2016-04-18 NOTE — Progress Notes (Signed)
Discharge summary reviewed with pt.  Rx given with directions verb u/o

## 2016-04-18 NOTE — Discharge Summary (Signed)
Physician Discharge Summary  Patient ID: Tanya Cline MRN: [id number] DOB/AGE: 54-Oct-1962 54 y.o.  Admit date: 04/15/2016 Discharge date: 04/18/2016  Admission Diagnoses: Menorrhagia  Discharge Diagnoses:  Active Problems:   Menorrhagia   Symptomatic anemia   Discharged Condition: good  Hospital Course:  Patient admitted for menorrhagia and symptomatic anemia.  Started on provera 20mg  po tid, eventually had lysteda added.  Received a total of 4 units of pRBC, Hgb stable at 9.5.  Will need to follow up outpatient   Consults: None  Significant Diagnostic Studies:  Results for orders placed or performed during the hospital encounter of 04/15/16 (from the past 24 hour(s))  Prepare RBC     Status: None   Collection Time: 04/17/16 11:30 AM  Result Value Ref Range   Order Confirmation ORDER PROCESSED BY BLOOD BANK   CBC     Status: Abnormal   Collection Time: 04/17/16 10:02 PM  Result Value Ref Range   WBC 11.0 3.6 - 11.0 K/uL   RBC 3.18 (L) 3.80 - 5.20 MIL/uL   Hemoglobin 9.5 (L) 12.0 - 16.0 g/dL   HCT 27.4 (L) 35.0 - 47.0 %   MCV 86.1 80.0 - 100.0 fL   MCH 29.8 26.0 - 34.0 pg   MCHC 34.6 32.0 - 36.0 g/dL   RDW 14.9 (H) 11.5 - 14.5 %   Platelets 180 150 - 440 K/uL  CBC     Status: Abnormal   Collection Time: 04/18/16  6:08 AM  Result Value Ref Range   WBC 11.8 (H) 3.6 - 11.0 K/uL   RBC 3.16 (L) 3.80 - 5.20 MIL/uL   Hemoglobin 9.5 (L) 12.0 - 16.0 g/dL   HCT 27.3 (L) 35.0 - 47.0 %   MCV 86.2 80.0 - 100.0 fL   MCH 30.1 26.0 - 34.0 pg   MCHC 34.9 32.0 - 36.0 g/dL   RDW 15.3 (H) 11.5 - 14.5 %   Platelets 181 150 - 440 K/uL   US Transvaginal Non-ob  Result Date: 04/15/2016 CLINICAL DATA:  Vaginal bleeding EXAM: TRANSABDOMINAL AND TRANSVAGINAL ULTRASOUND OF PELVIS TECHNIQUE: Study was performed transabdominally to optimize pelvic field of view evaluation and transvaginally to optimize internal visceral architecture evaluation. COMPARISON:  CT abdomen and pelvis Aug 23, 2009 FINDINGS: Uterus Measurements: 9.6 x 5.1 x 6.0 cm. No fibroids or other mass visualized. Endometrium Thickness: 4 mm. A small amount of fluid, likely hemorrhage, is seen in the endometrium. The contour of the endometrium is smooth. Right ovary Not visualized by transabdominal and transvaginal technique. No mass seen in the right pelvic region. Left ovary Measurements: 6.2 x 4.2 x 7.2 cm. There is a complex partially cystic mass which contains an area of noncystic material. This lesion measures 8.3 x 4.1 x 7.8 cm. The solid component within this lesion measures 6.8 x 3.5 x 6.0 cm. Immediately adjacent to this larger lesion, there is a somewhat complex predominantly cystic lesion which contains areas of apparent nodularity measuring 3.7 x 3.0 x 3.8 cm. Smaller predominantly cystic areas arise from the left ovary as well. Other findings Small amount of free pelvic fluid. IMPRESSION: Complex left ovarian masses, primarily cystic but with solid components as well as areas of thickened septation. 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 Right ovary could not be visualized by transabdominal or transvaginal technique. No mass to the right of midline evident. Note that the largest mass which appears to arise from the left ovary eccentrically extends  toward the midline near the uterus. Small amount of free pelvic fluid. Question recent ovarian cyst rupture or leakage. Small amount of apparent hemorrhage within the endometrium. Endometrium is not thickened. Given concern for potential ovarian neoplasm, CT of the abdomen and pelvis following oral and intravenous contrast advised to further evaluate the pelvis as well as to assess for potential metastatic foci. Gynecologic consultation also advised. Electronically Signed   By: Lowella Grip III M.D.   On: 04/15/2016 09:26   US Pelvis Complete  Result Date: 04/15/2016 CLINICAL DATA:  Vaginal  bleeding EXAM: TRANSABDOMINAL AND TRANSVAGINAL ULTRASOUND OF PELVIS TECHNIQUE: Study was performed transabdominally to optimize pelvic field of view evaluation and transvaginally to optimize internal visceral architecture evaluation. COMPARISON:  CT abdomen and pelvis Aug 23, 2009 FINDINGS: Uterus Measurements: 9.6 x 5.1 x 6.0 cm. No fibroids or other mass visualized. Endometrium Thickness: 4 mm. A small amount of fluid, likely hemorrhage, is seen in the endometrium. The contour of the endometrium is smooth. Right ovary Not visualized by transabdominal and transvaginal technique. No mass seen in the right pelvic region. Left ovary Measurements: 6.2 x 4.2 x 7.2 cm. There is a complex partially cystic mass which contains an area of noncystic material. This lesion measures 8.3 x 4.1 x 7.8 cm. The solid component within this lesion measures 6.8 x 3.5 x 6.0 cm. Immediately adjacent to this larger lesion, there is a somewhat complex predominantly cystic lesion which contains areas of apparent nodularity measuring 3.7 x 3.0 x 3.8 cm. Smaller predominantly cystic areas arise from the left ovary as well. Other findings Small amount of free pelvic fluid. IMPRESSION: Complex left ovarian masses, primarily cystic but with solid components as well as areas of thickened septation. 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 Right ovary could not be visualized by transabdominal or transvaginal technique. No mass to the right of midline evident. Note that the largest mass which appears to arise from the left ovary eccentrically extends toward the midline near the uterus. Small amount of free pelvic fluid. Question recent ovarian cyst rupture or leakage. Small amount of apparent hemorrhage within the endometrium. Endometrium is not thickened. Given concern for potential ovarian neoplasm, CT of the abdomen and pelvis following oral and intravenous contrast advised to  further evaluate the pelvis as well as to assess for potential metastatic foci. Gynecologic consultation also advised. Electronically Signed   By: Lowella Grip III M.D.   On: 04/15/2016 09:26     Treatments: blood transfusion  Discharge Exam: Blood pressure 103/67, pulse 84, temperature 98.5 F (36.9 C), temperature source Oral, resp. rate 18, height 5\' 5"  (1.651 m), weight 148 lb (67.1 kg), last menstrual period 03/24/2016, SpO2 99 %. General appearance: alert, appears stated age and no distress Resp: clear to auscultation bilaterally Cardio: RRR GI: soft, non-tender; bowel sounds normal; no masses,  no organomegaly Extremities: extremities normal, atraumatic, no cyanosis or edema  Disposition: 01-Home or Self Care  Discharge Instructions    Activity as tolerated    Complete by:  As directed    Call MD for:  difficulty breathing, headache or visual disturbances    Complete by:  As directed    Call MD for:  extreme fatigue    Complete by:  As directed    Call MD for:  hives    Complete by:  As directed    Call MD for:  persistant dizziness or light-headedness    Complete by:  As directed    Call MD for:  persistant nausea and vomiting    Complete by:  As directed    Call MD for:  severe uncontrolled pain    Complete by:  As directed    Call MD for:  temperature >100.4    Complete by:  As directed    Diet - low sodium heart healthy    Complete by:  As directed      Allergies as of 04/18/2016      Reactions   Codeine Nausea And Vomiting      Medication List    STOP taking these medications   BC HEADACHE POWDER PO   GERITOL PO   traMADol 50 MG tablet Commonly known as:  ULTRAM   verapamil 180 MG CR tablet Commonly known as:  CALAN-SR     TAKE these medications   clonazePAM 0.5 MG tablet Commonly known as:  KLONOPIN Take 0.5 mg by mouth 2 (two) times daily as needed for anxiety.   ferrous sulfate 325 (65 FE) MG tablet Take 1 tablet (325 mg total) by mouth  3 (three) times daily with meals.   medroxyPROGESTERone 10 MG tablet Commonly known as:  PROVERA Take 2 tablets (20 mg total) by mouth 3 (three) times daily.   medroxyPROGESTERone 10 MG tablet Commonly known as:  PROVERA 2 tablets (20mg ) po tid for 1 week then 2 tablets (20mg ) po once daily   multivitamin tablet Take 1 tablet by mouth daily.   tranexamic acid 650 MG Tabs tablet Commonly known as:  LYSTEDA Take 2 tablets (1,300 mg total) by mouth 3 (three) times daily.   ZADITOR 0.025 % ophthalmic solution Generic drug:  ketotifen Place 1 drop into both eyes 2 (two) times daily as needed.      Follow-up Information    Go to  Dorthula Nettles, MD.   Specialty:  Obstetrics and Gynecology Why:  tomorrow at [facility] information: [address] [place] [address] [phone]           Signed: Dorthula Nettles 04/18/2016, 8:56 AM

## 2016-04-18 NOTE — Progress Notes (Signed)
Discharged to home.  To car with auxillary

## 2016-05-19 ENCOUNTER — Ambulatory Visit: Payer: PRIVATE HEALTH INSURANCE

## 2016-05-26 ENCOUNTER — Inpatient Hospital Stay: Payer: BLUE CROSS/BLUE SHIELD

## 2016-05-26 ENCOUNTER — Encounter: Payer: Self-pay | Admitting: Obstetrics and Gynecology

## 2016-05-26 ENCOUNTER — Inpatient Hospital Stay: Payer: BLUE CROSS/BLUE SHIELD | Attending: Obstetrics and Gynecology | Admitting: Obstetrics and Gynecology

## 2016-05-26 ENCOUNTER — Other Ambulatory Visit: Payer: Self-pay | Admitting: Obstetrics and Gynecology

## 2016-05-26 ENCOUNTER — Encounter (INDEPENDENT_AMBULATORY_CARE_PROVIDER_SITE_OTHER): Payer: Self-pay

## 2016-05-26 VITALS — BP 131/91 | HR 90 | Temp 96.8°F | Resp 18 | Ht 65.0 in | Wt 147.3 lb

## 2016-05-26 DIAGNOSIS — N760 Acute vaginitis: Secondary | ICD-10-CM | POA: Insufficient documentation

## 2016-05-26 DIAGNOSIS — N809 Endometriosis, unspecified: Secondary | ICD-10-CM | POA: Diagnosis not present

## 2016-05-26 DIAGNOSIS — B9689 Other specified bacterial agents as the cause of diseases classified elsewhere: Secondary | ICD-10-CM | POA: Diagnosis not present

## 2016-05-26 DIAGNOSIS — R002 Palpitations: Secondary | ICD-10-CM | POA: Diagnosis not present

## 2016-05-26 DIAGNOSIS — N9089 Other specified noninflammatory disorders of vulva and perineum: Secondary | ICD-10-CM | POA: Diagnosis not present

## 2016-05-26 DIAGNOSIS — R971 Elevated cancer antigen 125 [CA 125]: Secondary | ICD-10-CM | POA: Insufficient documentation

## 2016-05-26 DIAGNOSIS — N920 Excessive and frequent menstruation with regular cycle: Secondary | ICD-10-CM | POA: Diagnosis not present

## 2016-05-26 DIAGNOSIS — F419 Anxiety disorder, unspecified: Secondary | ICD-10-CM | POA: Diagnosis not present

## 2016-05-26 DIAGNOSIS — I10 Essential (primary) hypertension: Secondary | ICD-10-CM | POA: Diagnosis not present

## 2016-05-26 DIAGNOSIS — Z79899 Other long term (current) drug therapy: Secondary | ICD-10-CM | POA: Diagnosis not present

## 2016-05-26 DIAGNOSIS — N921 Excessive and frequent menstruation with irregular cycle: Secondary | ICD-10-CM | POA: Diagnosis not present

## 2016-05-26 DIAGNOSIS — N7011 Chronic salpingitis: Secondary | ICD-10-CM | POA: Insufficient documentation

## 2016-05-26 DIAGNOSIS — J45909 Unspecified asthma, uncomplicated: Secondary | ICD-10-CM | POA: Diagnosis not present

## 2016-05-26 DIAGNOSIS — N83202 Unspecified ovarian cyst, left side: Secondary | ICD-10-CM | POA: Diagnosis not present

## 2016-05-26 DIAGNOSIS — N83299 Other ovarian cyst, unspecified side: Secondary | ICD-10-CM

## 2016-05-26 DIAGNOSIS — D500 Iron deficiency anemia secondary to blood loss (chronic): Secondary | ICD-10-CM | POA: Insufficient documentation

## 2016-05-26 DIAGNOSIS — F17210 Nicotine dependence, cigarettes, uncomplicated: Secondary | ICD-10-CM | POA: Diagnosis not present

## 2016-05-26 LAB — WET PREP, GENITAL
Clue Cells Wet Prep HPF POC: NONE SEEN
Sperm: NONE SEEN
Trich, Wet Prep: NONE SEEN
Yeast Wet Prep HPF POC: NONE SEEN

## 2016-05-26 MED ORDER — METRONIDAZOLE 500 MG PO TABS: 500.0000 mg | ORAL_TABLET | Freq: Two times a day (BID) | ORAL | 0 refills | Status: AC

## 2016-05-26 NOTE — Progress Notes (Signed)
  Oncology Nurse Navigator Documentation Chaperoned pelvic exam. Wet prep, ENB, and pap sent to lab. She will return 2/21 for results Navigator Location: [facility] (05/26/16 1500)   )Navigator Encounter Type: Initial GynOnc (05/26/16 1500)                     Patient Visit Type: GynOnc (05/26/16 1500)                    Acuity: Level 2 (05/26/16 1500)   Acuity Level 2: Initial guidance, education and coordination as needed;Educational needs;Ongoing guidance and education throughout treatment as needed (05/26/16 1500)     Time Spent with Patient: 30 (05/26/16 1500)

## 2016-05-26 NOTE — Progress Notes (Signed)
Gynecologic Oncology Consult Visit   Referring Provider: Malachy Mood, MD     Chief Concern: Ovarian mass in the setting of menorrhagia and symptomatic anemia  Subjective:  Tanya Cline is a 56 y.o. female who is seen in consultation from Dr. Georgianne Fick for complex ovarian cystic mass in the setting of menorrhagia and symptomatic anemia.   Tanya Cline  is a 56 y.o. [phone] presenting after a 9 month period of amenorrhea followed by severe menorrhagia (last normal LMP 06/2015). She was admitted for menorrhagia and symptomatic anemia.  Started on provera 20mg  po tid, eventually had Lysteda added.  Received a total of 4 units of pRBC, Hgb stable at 9.5. She reports bleeding starting on 04/11/2016, initially light then began increasing in intensity on 04/13/2016. She denies prior history of menorrhagia.  No family or personal history of bleeding disorders.  She has received a prior blood transfusion with surgery for a ruptured ectopic s/p laparscopic removal and is missing her right ovary and tube.  She also has a h/o endometriosis and hydrosalpinx.  04/15/2016  TRANSABDOMINAL AND TRANSVAGINAL ULTRASOUND OF PELVIS TECHNIQUE: Study was performed transabdominally to optimize pelvic field of view evaluation and transvaginally to optimize internal visceral architecture evaluation. COMPARISON:  CT abdomen and pelvis Aug 23, 2009 FINDINGS: Uterus Measurements: 9.6 x 5.1 x 6.0 cm. No fibroids or other mass visualized. Endometrium Thickness: 4 mm. A small amount of fluid, likely hemorrhage, is seen in the endometrium. The contour of the endometrium is smooth. Right ovary Not visualized by transabdominal and transvaginal technique. No mass seen in the right pelvic region. Left ovary Measurements: 6.2 x 4.2 x 7.2 cm. There is a complex partially cystic mass which contains an area of noncystic material. This lesion measures 8.3 x 4.1 x 7.8 cm. The solid component within this lesion measures 6.8 x 3.5 x  6.0 cm. Immediately adjacent to this larger lesion, there is a somewhat complex predominantly cystic lesion which contains areas of apparent nodularity measuring 3.7 x 3.0 x 3.8 cm. Smaller predominantly cystic areas arise from the left ovary as well. Other findings Small amount of free pelvic fluid. IMPRESSION: Complex left ovarian masses, primarily cystic but with solid components as well as areas of thickened septation. 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 Right ovary could not be visualized by transabdominal or transvaginal technique. No mass to the right of midline evident. Note that the largest mass which appears to arise from the left ovary eccentrically extends toward the midline near the uterus. Small amount of free pelvic fluid. Question recent ovarian cyst rupture or leakage. Small amount of apparent hemorrhage within the endometrium. Endometrium is not thickened. Given concern for potential ovarian neoplasm, CT of the abdomen and pelvis following oral and intravenous contrast advised to further evaluate the pelvis as well as to assess for potential metastatic foci.  bHCG negative  CA125 44.5 Inhibin B 10  She presents today for assessment. Her bleeding has stopped. She has no gynecologic complaints. She works as a Theatre stage manager.   Problem List: Patient Active Problem List   Diagnosis Date Noted  . Ovarian cyst, complex 05/26/2016  . Vulvar lesion 05/26/2016  . Symptomatic anemia 04/17/2016  . Menorrhagia 04/15/2016  . Palpitations 01/04/2012    Past Medical History: Past Medical History:  Diagnosis Date  . Anxiety state, unspecified   . Asthma   . Ectopic pregnancy   . Endometriosis   . Essential hypertension, benign   .  Other and unspecified ovarian cyst   . Other disorder of menstruation and other abnormal bleeding from female genital tract     Past Surgical History: Past Surgical History:   Procedure Laterality Date  . ECTOPIC PREGNANCY SURGERY      Past Gynecologic History:  Menarche: 16 Menstrual details:per HPI Menses regular: no Last Menstrual Period: 03/2016 History of Abnormal pap: no, unknown Last pap: unknown Contraception: no method Sexually active: not asked  OB History:  OB History  Gravida Para Term Preterm AB Living  6 3     3 3   SAB TAB Ectopic Multiple Live Births  2   1        # Outcome Date GA Lbr Len/2nd Weight Sex Delivery Anes PTL Lv  6 SAB           5 SAB           4 Ectopic           3 Para           2 Para           1 Para               Family History: Family History  Problem Relation Age of Onset  . Hypertension Mother   . Hypertension Father   . Hypertension Sister   . Heart disease Maternal Grandfather   . Hypertension Sister     Social History: Social History   Social History  . Marital status: Single    Spouse name: N/A  . Number of children: N/A  . Years of education: N/A   Occupational History  . Not on file.   Social History Main Topics  . Smoking status: Current Every Day Smoker    Packs/day: 0.25    Years: 10.00    Types: Cigarettes  . Smokeless tobacco: Never Used  . Alcohol use Yes     Comment: socially  . Drug use: No  . Sexual activity: No   Other Topics Concern  . Not on file   Social History Narrative  . No narrative on file    Allergies: Allergies  Allergen Reactions  . Codeine Nausea And Vomiting    Current Medications: Current Outpatient Prescriptions  Medication Sig Dispense Refill  . clonazePAM (KLONOPIN) 0.5 MG tablet Take 0.5 mg by mouth 2 (two) times daily as needed for anxiety.    . ferrous sulfate 325 (65 FE) MG tablet Take 1 tablet (325 mg total) by mouth 3 (three) times daily with meals. 90 tablet 3  . verapamil (CALAN-SR) 180 MG CR tablet Take 180 mg by mouth at bedtime.    . metroNIDAZOLE (FLAGYL) 500 MG tablet Take 1 tablet (500 mg total) by mouth 2 (two) times  daily. 14 tablet 0  . Multiple Vitamin (MULTIVITAMIN) tablet Take 1 tablet by mouth daily.     No current facility-administered medications for this visit.     Review of Systems General: negative for, fevers, changes in weight or appetite Skin: negative for changes in color, texture, moles or lesions Eyes: negative for, changes in vision HEENT: negative for, change in hearing, tinnitus, voice changes Pulmonary: negative for cough, dyspnea on exertion, hemoptysis and wheezing Cardiac: positive for palpitations, negative for, pain; palpitations are occasional.  Gastrointestinal: negative for, nausea, vomiting, pain, constipation, diarrhea, hematochezia Genitourinary/Sexual: negative for, dysuria, infections, incontinence Ob/Gyn: negative for, pain; see HPI Musculoskeletal: negative for, pain Hematology: negative for, easy bruising, bleeding Neurologic/Psych: positive for headaches, negative  for, seizures, paralysis, weakness  Objective:  Physical Examination:  BP (!) 131/91   Pulse 90   Temp (!) 96.8 F (36 C) (Oral)   Resp 18   Ht 5\' 5"  (1.651 m)   Wt 147 lb 4.3 oz (66.8 kg)   BMI 24.51 kg/m    ECOG Performance Status: 0 - Asymptomatic  General appearance: alert, cooperative and appears stated age HEENT:PERRLA, extra ocular movement intact and sclera clear, anicteric Lymph node survey: non-palpable, axillary, inguinal, supraclavicular Cardiovascular: regular rate and rhythm Respiratory: normal air entry, lungs clear to auscultation Abdomen: soft, non-tender, without masses or organomegaly, protuberant, no masses palpated, no hernias and well healed incision Back: inspection of back is normal Extremities: extremities normal, atraumatic, no cyanosis or edema Neurological exam reveals alert, oriented, normal speech, no focal findings or movement disorder noted.  Pelvic: exam chaperoned by nurse;  Vulva: vulvar lesion mons area associated with thickening of the skin and vulvar  hyperpigmentation; Vagina: normal vagina with white thin discharge consistent with BV; Adnexa: tenderness left side with fullness. Possible palpation of the inferior aspect of the mass, smooth, but unable to assess size or mobility; Uterus: enlarged and globular to 10 week's size; Cervix: multiparous appearance, no lesions; Rectal: not indicated   EMBx performed.   The risks and benefits of the procedure were reviewed and informed consent obtained. Time out verbalized. The patient received pre-procedure teaching and expressed understanding. The post-procedure instructions were reviewed with the patient and she expressed understanding. The patient does not have any barriers to learning.  Speculum placed in the vaginal vault and cervix identified. Cervix cleansed with Betadine. The pipelle advanced to 9 cm. The specimen was obtained without difficult. Hemostasis obtained adequate.  Post-procedure evaluation the patient was stable without complaints.    Lab Review Labs on site today: HE4 and [facility] ordered  Radiologic Imaging: N/a Pelvic ultrasound films reviewed.    Assessment:  Tanya Cline is a 56 y.o. perimenopausal female diagnosed with asymptomatic complex cystic ovarian mass in the setting of abnormal vaginal bleeding and symptomatic anemia with history of endometriosis, ectopic pregnancy, and hydrosalpinx. Slightly elevated CA125 but not overly concerning given her age and perimenopausal status with h/o endometriosis.   Vulvar lesion.   Vaginal discharge c/w bacterial vaginosis.   Medical co-morbidities complicating care: prior abdominal surgery.  Plan:   Problem List Items Addressed This Visit      Genitourinary   Ovarian cyst, complex     Other   Menorrhagia - Primary   Relevant Orders   Wet prep, genital   Pap liquid-based and HPV (high risk)   Surgical pathology   Human Epididymis Prot 4,Serial   Follicle stimulating hormone   Vulvar lesion      The  following studies were ordered HE4, wet prep, Pap, EMBX, and FSH.   We recommended treatment with Flagyl for BV and she was given a a prescription today.   At her next visit we will biopsy the vulvar lesion.   The patient's diagnosis, an outline of the further diagnostic and laboratory studies which will be required, the recommendation, and alternatives were discussed.  All questions were answered to the patient's satisfaction.  A total of 75 minutes were spent with the patient/family today; 60% was spent in education, counseling and coordination of care.  Gillis Ends, MD    CC:  Malachy Mood, MD

## 2016-05-26 NOTE — Patient Instructions (Signed)
Metronidazole tablets or capsules What is this medicine? METRONIDAZOLE (me troe NI da zole) is an antiinfective. It is used to treat certain kinds of bacterial and protozoal infections. It will not work for colds, flu, or other viral infections. This medicine may be used for other purposes; ask your health care provider or pharmacist if you have questions. COMMON BRAND NAME(S): Flagyl What should I tell my health care provider before I take this medicine? They need to know if you have any of these conditions: -anemia or other blood disorders -disease of the nervous system -fungal or yeast infection -if you drink alcohol containing drinks -liver disease -seizures -an unusual or allergic reaction to metronidazole, or other medicines, foods, dyes, or preservatives -pregnant or trying to get pregnant -breast-feeding How should I use this medicine? Take this medicine by mouth with a full glass of water. Follow the directions on the prescription label. Take your medicine at regular intervals. Do not take your medicine more often than directed. Take all of your medicine as directed even if you think you are better. Do not skip doses or stop your medicine early. Talk to your pediatrician regarding the use of this medicine in children. Special care may be needed. Overdosage: If you think you have taken too much of this medicine contact a poison control center or emergency room at once. NOTE: This medicine is only for you. Do not share this medicine with others. What if I miss a dose? If you miss a dose, take it as soon as you can. If it is almost time for your next dose, take only that dose. Do not take double or extra doses. What may interact with this medicine? Do not take this medicine with any of the following medications: -alcohol or any product that contains alcohol -amprenavir oral solution -cisapride -disulfiram -dofetilide -dronedarone -paclitaxel injection -pimozide -ritonavir oral  solution -sertraline oral solution -sulfamethoxazole-trimethoprim injection -thioridazine -ziprasidone This medicine may also interact with the following medications: -birth control pills -cimetidine -lithium -other medicines that prolong the QT interval (cause an abnormal heart rhythm) -phenobarbital -phenytoin -warfarin This list may not describe all possible interactions. Give your health care provider a list of all the medicines, herbs, non-prescription drugs, or dietary supplements you use. Also tell them if you smoke, drink alcohol, or use illegal drugs. Some items may interact with your medicine. What should I watch for while using this medicine? Tell your doctor or health care professional if your symptoms do not improve or if they get worse. You may get drowsy or dizzy. Do not drive, use machinery, or do anything that needs mental alertness until you know how this medicine affects you. Do not stand or sit up quickly, especially if you are an older patient. This reduces the risk of dizzy or fainting spells. Avoid alcoholic drinks while you are taking this medicine and for three days afterward. Alcohol may make you feel dizzy, sick, or flushed. If you are being treated for a sexually transmitted disease, avoid sexual contact until you have finished your treatment. Your sexual partner may also need treatment. What side effects may I notice from receiving this medicine? Side effects that you should report to your doctor or health care professional as soon as possible: -allergic reactions like skin rash or hives, swelling of the face, lips, or tongue -confusion, clumsiness -difficulty speaking -discolored or sore mouth -dizziness -fever, infection -numbness, tingling, pain or weakness in the hands or feet -trouble passing urine or change in the amount of   urine -redness, blistering, peeling or loosening of the skin, including inside the mouth -seizures -unusually weak or  tired -vaginal irritation, dryness, or discharge Side effects that usually do not require medical attention (report to your doctor or health care professional if they continue or are bothersome): -diarrhea -headache -irritability -metallic taste -nausea -stomach pain or cramps -trouble sleeping This list may not describe all possible side effects. Call your doctor for medical advice about side effects. You may report side effects to FDA at 1-800-FDA-1088. Where should I keep my medicine? Keep out of the reach of children. Store at room temperature below 25 degrees C (77 degrees F). Protect from light. Keep container tightly closed. Throw away any unused medicine after the expiration date. NOTE: This sheet is a summary. It may not cover all possible information. If you have questions about this medicine, talk to your doctor, pharmacist, or health care provider.  2017 Elsevier/Gold Standard (2012-11-03 14:08:39)    Abnormal Uterine Bleeding Abnormal uterine bleeding means bleeding from the vagina that is not your normal menstrual period. This can be:  Bleeding or spotting between periods.  Bleeding after sex (sexual intercourse).  Bleeding that is heavier or more than normal.  Periods that last longer than usual.  Bleeding after menopause. There are many problems that may cause this. Treatment will depend on the cause of the bleeding. Any kind of bleeding that is not normal should be reviewed by your doctor. Follow these instructions at home: Watch your condition for any changes. These actions may lessen any discomfort you are having:  Do not use tampons or douches as told by your doctor.  Change your pads often. You should get regular pelvic exams and Pap tests. Keep all appointments for tests as told by your doctor. Contact a doctor if:  You are bleeding for more than 1 week.  You feel dizzy at times. Get help right away if:  You pass out.  You have to change pads  every 15 to 30 minutes.  You have belly pain.  You have a fever.  You become sweaty or weak.  You are passing large blood clots from the vagina.  You feel sick to your stomach (nauseous) and throw up (vomit). This information is not intended to replace advice given to you by your health care provider. Make sure you discuss any questions you have with your health care provider. Document Released: 01/24/2009 Document Revised: 09/04/2015 Document Reviewed: 10/26/2012 Elsevier Interactive Patient Education  2017 [employer].   Ovarian Cyst An ovarian cyst is a fluid-filled sac on an ovary. The ovaries are organs that make eggs in women. Most ovarian cysts go away on their own and are not cancerous (are benign). Some cysts need treatment. Follow these instructions at home:  Take over-the-counter and prescription medicines only as told by your doctor.  Do not drive or use heavy machinery while taking prescription pain medicine.  Get pelvic exams and Pap tests as often as told by your doctor.  Return to your normal activities as told by your doctor. Ask your doctor what activities are safe for you.  Do not use any products that contain nicotine or tobacco, such as cigarettes and e-cigarettes. If you need help quitting, ask your doctor.  Keep all follow-up visits as told by your doctor. This is important. Contact a doctor if:  Your periods are:  Late.  Irregular.  Painful.  Your periods stop.  You have pelvic pain that does not go away.  You  have pressure on your bladder.  You have trouble making your bladder empty when you pee (urinate).  You have pain during sex.  You have any of the following in your belly (abdomen):  A feeling of fullness.  Pressure.  Discomfort.  Pain that does not go away.  Swelling.  You feel sick most of the time.  You have trouble pooping (have constipation).  You are not as hungry as usual (you lose your appetite).  You get  very bad acne.  You start to have more hair on your body and face.  You are gaining weight or losing weight without changing your exercise and eating habits.  You think you may be pregnant. Get help right away if:  You have belly pain that is very bad or gets worse.  You cannot eat or drink without throwing up (vomiting).  You suddenly get a fever.  Your period is a lot heavier than usual. This information is not intended to replace advice given to you by your health care provider. Make sure you discuss any questions you have with your health care provider. Document Released: 09/15/2007 Document Revised: 10/17/2015 Document Reviewed: 08/31/2015 Elsevier Interactive Patient Education  2017 [employer].  Endometrial Biopsy, Care After Refer to this sheet in the next few weeks. These instructions provide you with information on caring for yourself after your procedure. Your health care provider may also give you more specific instructions. Your treatment has been planned according to current medical practices, but problems sometimes occur. Call your health care provider if you have any problems or questions after your procedure. WHAT TO EXPECT AFTER THE PROCEDURE After your procedure, it is typical to have the following:  You may have mild cramping and a small amount of vaginal bleeding for a few days after the procedure. This is normal. HOME CARE INSTRUCTIONS  Only take over-the-counter or prescription medicine as directed by your health care provider.  Do not douche, use tampons, or have sexual intercourse until your health care provider approves.  Follow your health care provider's instructions regarding any activity restrictions, such as strenuous exercise or heavy lifting. SEEK MEDICAL CARE IF:  You have heavy bleeding or bleeding longer than 2 days after the procedure.  You have bad smelling drainage from your vagina.  You have a fever and chills.  Youhave severe lower  stomach (abdominal) pain. SEEK IMMEDIATE MEDICAL CARE IF:  You have severe cramps in your stomach or back.  You pass large blood clots.  Your bleeding increases.  You become weak or lightheaded, or you pass out. This information is not intended to replace advice given to you by your health care provider. Make sure you discuss any questions you have with your health care provider. Document Released: 01/17/2013 Document Reviewed: 01/17/2013 Elsevier Interactive Patient Education  2017 [employer].

## 2016-05-27 ENCOUNTER — Telehealth: Payer: Self-pay

## 2016-05-27 LAB — FOLLICLE STIMULATING HORMONE: FSH: 10.8 m[IU]/mL

## 2016-05-27 LAB — SURGICAL PATHOLOGY

## 2016-05-27 NOTE — Telephone Encounter (Signed)
  Oncology Nurse Navigator Documentation Called and notified of negative wet prep results. Per Dr. Theora Gianotti she does not need to take the Flagyl. She states she has not picked the script up yet and voiced understanding that she does not need to take. Navigator Location: [facility] (05/27/16 0900)   )Navigator Encounter Type: Telephone;Diagnostic Results (05/27/16 0900)                                                    Time Spent with Patient: 15 (05/27/16 0900)

## 2016-05-29 LAB — PAP LB AND HPV HIGH-RISK
HPV, HIGH-RISK: NEGATIVE
PAP SMEAR COMMENT: 0

## 2016-05-29 LAB — HUMAN EPIDIDYMIS PROT 4,SERIAL: HE4 (HUMAN EPID PROT 4): 58.2 pmol/L (ref 0.0–105.2)

## 2016-06-02 ENCOUNTER — Inpatient Hospital Stay: Payer: BLUE CROSS/BLUE SHIELD

## 2016-06-04 ENCOUNTER — Telehealth: Payer: Self-pay | Admitting: Obstetrics and Gynecology

## 2016-06-04 ENCOUNTER — Telehealth: Payer: Self-pay

## 2016-06-04 NOTE — Telephone Encounter (Signed)
  Oncology Nurse Navigator Documentation Voicemail left with Tanya Cline to return call. Would like to go over all results from testing with her. Will notify Dr. Oneita Hurt office of the same. Navigator Location: [facility] (06/04/16 1700)   )Navigator Encounter Type: Telephone;Diagnostic Results (06/04/16 1700)                                                    Time Spent with Patient: 15 (06/04/16 1700)

## 2016-06-04 NOTE — Telephone Encounter (Signed)
I contacted Dr. Georgianne Fick and reviewed all of Tanya Cline's results. His office will contact Tanya Cline to review her management options which include surgery, medical management, and continued observation. She will return to our clinic for vulvar biopsy.   CA125/HE4: ROMA score 11% risk of malignancy  FSH=10.8 premenopausal  Wet prep: unremarkable  EMBx:  DIAGNOSIS:  A. ENDOMETRIUM; BIOPSY:  - WEAKLY PROLIFERATIVE ENDOMETRIUM WITH PROMINENT CILIATED CELLS.  - NEGATIVE FOR HYPERPLASIA, ATYPIA, AND MALIGNANCY.    Gillis Ends, MD

## 2016-06-07 ENCOUNTER — Telehealth: Payer: Self-pay

## 2016-06-07 NOTE — Telephone Encounter (Signed)
  Oncology Nurse Navigator Documentation Notified Tanya Cline of negative labs and endometrial biopsy. Dr. Danielle Rankin office will contact her regarding surgery. Navigator Location: [facility] (06/07/16 0900)   )Navigator Encounter Type: Telephone;Diagnostic Results (06/07/16 0900)                                                    Time Spent with Patient: 15 (06/07/16 0900)

## 2016-06-10 ENCOUNTER — Telehealth: Payer: Self-pay | Admitting: Obstetrics and Gynecology

## 2016-06-10 NOTE — Telephone Encounter (Signed)
LVM for patient to cb and scheduled 2 wk follow up with [facility]

## 2016-06-10 NOTE — Telephone Encounter (Signed)
-----   Message from Malachy Mood, MD sent at 06/08/2016  6:04 PM EST ----- Regarding: Appointment Schedule patient for follow up appointment to discuss long-term management of her bleeding and imaging findings in the next 2 weeks

## 2016-06-15 ENCOUNTER — Ambulatory Visit: Payer: Self-pay | Admitting: Obstetrics and Gynecology

## 2016-06-23 ENCOUNTER — Inpatient Hospital Stay: Payer: BLUE CROSS/BLUE SHIELD

## 2016-06-30 ENCOUNTER — Inpatient Hospital Stay: Payer: BLUE CROSS/BLUE SHIELD

## 2016-07-13 ENCOUNTER — Ambulatory Visit: Payer: Self-pay | Admitting: Obstetrics and Gynecology

## 2016-07-21 ENCOUNTER — Inpatient Hospital Stay: Payer: BLUE CROSS/BLUE SHIELD

## 2016-11-01 ENCOUNTER — Ambulatory Visit (INDEPENDENT_AMBULATORY_CARE_PROVIDER_SITE_OTHER): Payer: BLUE CROSS/BLUE SHIELD | Admitting: Obstetrics and Gynecology

## 2016-11-01 ENCOUNTER — Encounter: Payer: Self-pay | Admitting: Obstetrics and Gynecology

## 2016-11-01 VITALS — BP 138/100 | HR 91 | Ht 65.0 in | Wt 153.0 lb

## 2016-11-01 DIAGNOSIS — N83202 Unspecified ovarian cyst, left side: Secondary | ICD-10-CM | POA: Diagnosis not present

## 2016-11-01 NOTE — Progress Notes (Signed)
Obstetrics & Gynecology Office Visit   Chief Complaint:  Chief Complaint  Patient presents with  . Follow-up    possible surgery    History of Present Illness: 56 year old presenting for follow up to discuss management of complex left ovarian cyst.  This has been present to some extent for some time, tumor markers were negative.  She is largely asymptomatic but has occasional pelvic fullness and discomfort.  This was initially noted on ultrasound for evaluation of menorrhagia, requiring in patient admission and transfusion.  She underwent consultation with Dr. Gevena Cotton.  Discussed options of surgical management vs expectant management.  She has decided to proceed with surgical management.  She has not had any further bleeding since the start of the year.  Endometrial biopsy was normal.     Review of Systems: 10 point review of systems negative unless otherwise noted in HPI  Past Medical History:  Past Medical History:  Diagnosis Date  . Anxiety state, unspecified   . Asthma   . Ectopic pregnancy   . Endometriosis   . Essential hypertension, benign   . Other and unspecified ovarian cyst   . Other disorder of menstruation and other abnormal bleeding from female genital tract     Past Surgical History:  Past Surgical History:  Procedure Laterality Date  . ECTOPIC PREGNANCY SURGERY      Gynecologic History: No LMP recorded.  Obstetric History: [id number]  Family History:  Family History  Problem Relation Age of Onset  . Hypertension Mother   . Hypertension Father   . Hypertension Sister   . Heart disease Maternal Grandfather   . Hypertension Sister   . Cancer Paternal Uncle     Social History:  Social History   Social History  . Marital status: Single    Spouse name: N/A  . Number of children: N/A  . Years of education: N/A   Occupational History  . Not on file.   Social History Main Topics  . Smoking status: Current Every Day Smoker    Packs/day:  0.25    Years: 10.00    Types: Cigarettes  . Smokeless tobacco: Never Used  . Alcohol use Yes     Comment: socially  . Drug use: No  . Sexual activity: No   Other Topics Concern  . Not on file   Social History Narrative  . No narrative on file    Allergies:  Allergies  Allergen Reactions  . Codeine Nausea And Vomiting    Medications: Prior to Admission medications   Medication Sig Start Date End Date Taking? Authorizing Provider  aspirin EC 81 MG tablet Take by mouth.   Yes [provider]  clonazePAM (KLONOPIN) 0.5 MG tablet Take 0.5 mg by mouth 2 (two) times daily as needed for anxiety.   Yes [provider]  Multiple Vitamin (MULTIVITAMIN) tablet Take 1 tablet by mouth daily.   Yes [provider]  verapamil (CALAN-SR) 180 MG CR tablet Take 180 mg by mouth at bedtime.   Yes [provider]    Physical Exam Vitals:  Vitals:   11/01/16 1401  BP: (!) 138/100  Pulse: 91   No LMP recorded.  General: NAD HEENT: normocephalic, anicteric Pulmonary: No increased work of breathing Neurologic: Grossly intact Psychiatric: mood appropriate, affect full  Assessment: 56 y.o. [id number] complex left ovarian cyst  Plan: Problem List Items Addressed This Visit    None    Visit Diagnoses    Left  ovarian cyst    -  Primary     - Schedule for laparoscopic left salpingo-oophorectomy sometime first week of August is patient's preference.  I have had a careful discussion with this patient about all the options available and the risk/benefits of each. I have fully informed this patient that a laparoscopy may subject her to a variety of discomforts and risks: She understands that most patients have surgery with little difficulty, but problems can happen ranging from minor to fatal. These include nausea, vomiting, pain, bleeding, infection, poor healing, hernia, or formation of adhesions. Unexpected reactions may occur from any drug or anesthetic  given. Unintended injury may occur to other pelvic or abdominal structures such as Fallopian tubes, ovaries, bladder, ureter (tube from kidney to bladder), or bowel. Nerves going from the pelvis to the legs may be injured. Any such injury may require immediate or later additional surgery to correct the problem. Excessive blood loss requiring transfusion is very unlikely but possible. Dangerous blood clots may form in the legs or lungs. Physical and sexual activity will be restricted in varying degrees for an indeterminate period of time but most often 2-4 weeks. She understands that the plan is to do this laparoscopically, however, there is a chance that this will need to be performed via a larger incision. Finally, she understands that it is impossible to list every possible undesirable effect and that the condition for which surgery is done is not always cured or significantly improved, and in rare cases may be even worsen. Ample time was given to answer all questions.  A total of 15 minutes were spent in face-to-face contact with the patient during this encounter with over half of that time devoted to counseling and coordination of care.

## 2016-11-02 ENCOUNTER — Telehealth: Payer: Self-pay | Admitting: Obstetrics and Gynecology

## 2016-11-02 NOTE — Telephone Encounter (Signed)
Lmtrc

## 2016-11-02 NOTE — Telephone Encounter (Signed)
-----   Message from Malachy Mood, MD sent at 11/01/2016  9:50 PM EDT ----- Regarding: surgery Surgery Date: 11/11/16  LOS: outpatient  Surgery Booking Request Patient Full Name: Tanya Cline MRN: [id number]  DOB: [birth year]  Surgeon: Malachy Mood, MD  Requested Surgery Date and Time: 11/11/16 Primary Diagnosis and Code: Left ovarian cyst Secondary Diagnosis and Code:  Surgical Procedure: laparoscopic left salpingo-oophorectomy L&D Notification:N/A Admission Status: same day surgery Length of Surgery: 1.5hrs Special Case Needs: none H&P:  (date) Phone Interview or Office Pre-Admit: either Interpreter: none Language: English Medical Clearance: none Special Scheduling Instructions: none

## 2016-11-03 NOTE — Telephone Encounter (Signed)
Patient is scheduled for OR on 11/23/16. Patient has my ext.

## 2016-11-15 ENCOUNTER — Encounter
Admission: RE | Admit: 2016-11-15 | Discharge: 2016-11-15 | Disposition: A | Discharge status: Home / Self Care | Payer: BLUE CROSS/BLUE SHIELD | Source: Ambulatory Visit | Attending: Obstetrics and Gynecology | Admitting: Obstetrics and Gynecology

## 2016-11-15 HISTORY — DX: Sleep apnea, unspecified: G47.30

## 2016-11-15 HISTORY — DX: Headache, unspecified: R51.9

## 2016-11-15 HISTORY — DX: Headache: R51

## 2016-11-15 NOTE — Patient Instructions (Signed)
  Your procedure is scheduled on:11/23/16 Report to Day Surgery. [facility]OND FLOOR To find out your arrival time please call [phone] between 1PM - 3PM on 11/22/16.  Remember: Instructions that are not followed completely may result in serious medical risk, up to and including death, or upon the discretion of your surgeon and anesthesiologist your surgery may need to be rescheduled.    __X__ 1. Do not eat food or drink liquids after midnight. No gum chewing or hard candies.     _X___ 2. No Alcohol for 24 hours before or after surgery.   __X__ 3. Do Not Smoke For 24 Hours Prior to Your Surgery.   ____ 4. Bring all medications with you on the day of surgery if instructed.    _X___ 5. Notify your doctor if there is any change in your medical condition     (cold, fever, infections).       Do not wear jewelry, make-up, hairpins, clips or nail polish.  Do not wear lotions, powders, or perfumes. You may wear deodorant.  Do not shave 48 hours prior to surgery. Men may shave face and neck.  Do not bring valuables to the hospital.    [facility] is not responsible for any belongings or valuables.               Contacts, dentures or bridgework may not be worn into surgery.  Leave your suitcase in the car. After surgery it may be brought to your room.  For patients admitted to the hospital, discharge time is determined by your                treatment team.   Patients discharged the day of surgery will not be allowed to drive home.     _X___ Take these medicines the morning of surgery with A SIP OF WATER:    1.VERAPAMIL  2. KLONOPIN IF NEEDED AM SURGERY  3.   4.  5.  6.  ____ Fleet Enema (as directed)   ____ Use CHG Soap as directed  ____ Use inhalers on the day of surgery  ____ Stop metformin 2 days prior to surgery    ____ Take 1/2 of usual insulin dose the night before surgery and none on the morning of surgery.   ____ Stop Coumadin/Plavix/aspirin on   ____  Stop Anti-inflammatories on    ____ Stop supplements until after surgery.    __X__ Bring C-Pap to the hospital.

## 2016-11-15 NOTE — Pre-Procedure Instructions (Signed)
PATIENT STATES OCCAS. SHARP PAINS IN CHEST ESPECIALLY WHEN BENDS AND BELCHES A LOT. STATES SHE SAW DR Nehemiah Massed 1 MONTH AGO AND WANTS EKG AGAIN BEFORE SURGERY. REQUEST FOR CLEARANCE CALLED AND FAXED TO DR Nehemiah Massed AS INSTRUCTED BY DR Randa Lynn. PATIENT AWARE. ALSO FAXED AND LM FOR NANCY AT DR Georgianne Fick

## 2016-11-18 NOTE — Pre-Procedure Instructions (Signed)
[place] RISK 11/18/16

## 2016-11-22 MED ORDER — CEFAZOLIN SODIUM-DEXTROSE 2-4 GM/100ML-% IV SOLN
2.0000 g | INTRAVENOUS | Status: AC
  Administered 2016-11-23: 2 g via INTRAVENOUS

## 2016-11-23 ENCOUNTER — Ambulatory Visit: Payer: BLUE CROSS/BLUE SHIELD | Admitting: Anesthesiology

## 2016-11-23 ENCOUNTER — Encounter: Payer: Self-pay | Admitting: *Deleted

## 2016-11-23 ENCOUNTER — Ambulatory Visit
Admission: RE | Admit: 2016-11-23 | Discharge: 2016-11-23 | Disposition: A | Discharge status: Home / Self Care | Payer: BLUE CROSS/BLUE SHIELD | Source: Ambulatory Visit | Attending: Obstetrics and Gynecology | Admitting: Obstetrics and Gynecology

## 2016-11-23 ENCOUNTER — Encounter
Admission: RE | Disposition: A | Discharge status: Home / Self Care | Payer: Self-pay | Source: Ambulatory Visit | Attending: Obstetrics and Gynecology

## 2016-11-23 DIAGNOSIS — R0789 Other chest pain: Secondary | ICD-10-CM | POA: Insufficient documentation

## 2016-11-23 DIAGNOSIS — Z90721 Acquired absence of ovaries, unilateral: Secondary | ICD-10-CM

## 2016-11-23 DIAGNOSIS — N83202 Unspecified ovarian cyst, left side: Secondary | ICD-10-CM | POA: Diagnosis present

## 2016-11-23 DIAGNOSIS — D271 Benign neoplasm of left ovary: Secondary | ICD-10-CM | POA: Diagnosis not present

## 2016-11-23 DIAGNOSIS — G4733 Obstructive sleep apnea (adult) (pediatric): Secondary | ICD-10-CM | POA: Insufficient documentation

## 2016-11-23 DIAGNOSIS — I10 Essential (primary) hypertension: Secondary | ICD-10-CM | POA: Diagnosis not present

## 2016-11-23 DIAGNOSIS — G4730 Sleep apnea, unspecified: Secondary | ICD-10-CM | POA: Diagnosis not present

## 2016-11-23 DIAGNOSIS — Z79899 Other long term (current) drug therapy: Secondary | ICD-10-CM | POA: Insufficient documentation

## 2016-11-23 DIAGNOSIS — J45909 Unspecified asthma, uncomplicated: Secondary | ICD-10-CM | POA: Insufficient documentation

## 2016-11-23 DIAGNOSIS — Z7982 Long term (current) use of aspirin: Secondary | ICD-10-CM | POA: Diagnosis not present

## 2016-11-23 DIAGNOSIS — F17210 Nicotine dependence, cigarettes, uncomplicated: Secondary | ICD-10-CM | POA: Insufficient documentation

## 2016-11-23 DIAGNOSIS — D259 Leiomyoma of uterus, unspecified: Secondary | ICD-10-CM | POA: Insufficient documentation

## 2016-11-23 LAB — POCT PREGNANCY, URINE: Preg Test, Ur: NEGATIVE

## 2016-11-23 LAB — TYPE AND SCREEN
ABO/RH(D): O POS
ANTIBODY SCREEN: NEGATIVE

## 2016-11-23 SURGERY — OOPHORECTOMY, LAPAROSCOPIC
Anesthesia: General | Laterality: Left | Wound class: Clean Contaminated

## 2016-11-23 MED ORDER — ACETAMINOPHEN NICU IV SYRINGE 10 MG/ML
INTRAVENOUS | Status: AC
  Filled 2016-11-23: qty 1

## 2016-11-23 MED ORDER — MIDAZOLAM HCL 2 MG/2ML IJ SOLN
INTRAMUSCULAR | Status: AC
  Filled 2016-11-23: qty 2

## 2016-11-23 MED ORDER — IBUPROFEN 600 MG PO TABS: 600.0000 mg | ORAL_TABLET | Freq: Four times a day (QID) | ORAL | 3 refills | Status: DC | PRN

## 2016-11-23 MED ORDER — PROMETHAZINE HCL 25 MG/ML IJ SOLN
INTRAMUSCULAR | Status: AC
  Administered 2016-11-23: 6.25 mg via INTRAVENOUS
  Filled 2016-11-23: qty 1

## 2016-11-23 MED ORDER — BUPIVACAINE HCL 0.5 % IJ SOLN
INTRAMUSCULAR | Status: DC | PRN
  Administered 2016-11-23: 12 mL

## 2016-11-23 MED ORDER — ROCURONIUM BROMIDE 50 MG/5ML IV SOLN
INTRAVENOUS | Status: AC
  Filled 2016-11-23: qty 1

## 2016-11-23 MED ORDER — LIDOCAINE HCL (PF) 2 % IJ SOLN
INTRAMUSCULAR | Status: AC
  Filled 2016-11-23: qty 2

## 2016-11-23 MED ORDER — SUGAMMADEX SODIUM 200 MG/2ML IV SOLN
INTRAVENOUS | Status: DC | PRN
  Administered 2016-11-23: 136 mg via INTRAVENOUS

## 2016-11-23 MED ORDER — ONDANSETRON HCL 4 MG/2ML IJ SOLN
4.0000 mg | Freq: Once | INTRAMUSCULAR | Status: AC | PRN
  Administered 2016-11-23: 4 mg via INTRAVENOUS

## 2016-11-23 MED ORDER — ONDANSETRON 4 MG PO TBDP: 4.0000 mg | ORAL_TABLET | Freq: Four times a day (QID) | ORAL | 0 refills | Status: DC | PRN

## 2016-11-23 MED ORDER — FENTANYL CITRATE (PF) 100 MCG/2ML IJ SOLN
INTRAMUSCULAR | Status: AC
  Filled 2016-11-23: qty 2

## 2016-11-23 MED ORDER — PROPOFOL 10 MG/ML IV BOLUS
INTRAVENOUS | Status: AC
  Filled 2016-11-23: qty 20

## 2016-11-23 MED ORDER — FENTANYL CITRATE (PF) 100 MCG/2ML IJ SOLN
INTRAMUSCULAR | Status: DC | PRN
  Administered 2016-11-23 (×2): 100 ug via INTRAVENOUS

## 2016-11-23 MED ORDER — LACTATED RINGERS IV SOLN
INTRAVENOUS | Status: DC
  Administered 2016-11-23: 10:00:00 via INTRAVENOUS

## 2016-11-23 MED ORDER — ONDANSETRON HCL 4 MG/2ML IJ SOLN
INTRAMUSCULAR | Status: AC
  Filled 2016-11-23: qty 2

## 2016-11-23 MED ORDER — ACETAMINOPHEN 10 MG/ML IV SOLN
INTRAVENOUS | Status: DC | PRN
  Administered 2016-11-23: 1000 mg via INTRAVENOUS

## 2016-11-23 MED ORDER — SUCCINYLCHOLINE CHLORIDE 20 MG/ML IJ SOLN
INTRAMUSCULAR | Status: AC
  Filled 2016-11-23: qty 1

## 2016-11-23 MED ORDER — ROCURONIUM BROMIDE 100 MG/10ML IV SOLN
INTRAVENOUS | Status: DC | PRN
  Administered 2016-11-23: 30 mg via INTRAVENOUS

## 2016-11-23 MED ORDER — PROMETHAZINE HCL 25 MG/ML IJ SOLN
6.2500 mg | Freq: Once | INTRAMUSCULAR | Status: AC
  Administered 2016-11-23: 6.25 mg via INTRAVENOUS

## 2016-11-23 MED ORDER — FENTANYL CITRATE (PF) 100 MCG/2ML IJ SOLN: 25.0000 ug | INTRAMUSCULAR | Status: DC | PRN

## 2016-11-23 MED ORDER — CEFAZOLIN SODIUM-DEXTROSE 2-4 GM/100ML-% IV SOLN
INTRAVENOUS | Status: AC
  Filled 2016-11-23: qty 100

## 2016-11-23 MED ORDER — BUPIVACAINE HCL (PF) 0.5 % IJ SOLN
INTRAMUSCULAR | Status: AC
  Filled 2016-11-23: qty 30

## 2016-11-23 MED ORDER — DEXAMETHASONE SODIUM PHOSPHATE 10 MG/ML IJ SOLN
INTRAMUSCULAR | Status: AC
  Filled 2016-11-23: qty 1

## 2016-11-23 MED ORDER — PROPOFOL 10 MG/ML IV BOLUS
INTRAVENOUS | Status: DC | PRN
  Administered 2016-11-23: 150 mg via INTRAVENOUS

## 2016-11-23 MED ORDER — FAMOTIDINE 20 MG PO TABS
ORAL_TABLET | ORAL | Status: AC
  Administered 2016-11-23: 20 mg via ORAL
  Filled 2016-11-23: qty 1

## 2016-11-23 MED ORDER — OXYCODONE-ACETAMINOPHEN 5-325 MG PO TABS: 1.0000 | ORAL_TABLET | Freq: Four times a day (QID) | ORAL | 0 refills | Status: DC | PRN

## 2016-11-23 MED ORDER — DEXAMETHASONE SODIUM PHOSPHATE 10 MG/ML IJ SOLN
INTRAMUSCULAR | Status: DC | PRN
  Administered 2016-11-23: 10 mg via INTRAVENOUS

## 2016-11-23 MED ORDER — FAMOTIDINE 20 MG PO TABS
20.0000 mg | ORAL_TABLET | Freq: Once | ORAL | Status: AC
  Administered 2016-11-23: 20 mg via ORAL

## 2016-11-23 MED ORDER — ONDANSETRON HCL 4 MG/2ML IJ SOLN
INTRAMUSCULAR | Status: DC | PRN
  Administered 2016-11-23: 4 mg via INTRAVENOUS

## 2016-11-23 MED ORDER — LIDOCAINE HCL (CARDIAC) 20 MG/ML IV SOLN
INTRAVENOUS | Status: DC | PRN
  Administered 2016-11-23: 60 mg via INTRAVENOUS

## 2016-11-23 MED ORDER — MIDAZOLAM HCL 2 MG/2ML IJ SOLN
INTRAMUSCULAR | Status: DC | PRN
  Administered 2016-11-23: 2 mg via INTRAVENOUS

## 2016-11-23 MED ORDER — SUGAMMADEX SODIUM 200 MG/2ML IV SOLN
INTRAVENOUS | Status: AC
  Filled 2016-11-23: qty 2

## 2016-11-23 SURGICAL SUPPLY — 38 items
ADH SKN CLS APL DERMABOND .7 (GAUZE/BANDAGES/DRESSINGS) ×1
ANCHOR TIS RET SYS 235ML (MISCELLANEOUS) ×3 IMPLANT
BAG TISS RTRVL C235 10X14 (MISCELLANEOUS) ×1
BAG URO DRAIN 2000ML W/SPOUT (MISCELLANEOUS) ×1 IMPLANT
BLADE SURG SZ11 CARB STEEL (BLADE) ×3 IMPLANT
CANISTER SUCT 1200ML W/VALVE (MISCELLANEOUS) ×3 IMPLANT
CATH FOLEY 2WAY  5CC 16FR (CATHETERS)
CATH FOLEY 2WAY 5CC 16FR (CATHETERS)
CATH ROBINSON RED A/P 16FR (CATHETERS) ×2 IMPLANT
CATH URTH 16FR FL 2W BLN LF (CATHETERS) ×1 IMPLANT
CHLORAPREP W/TINT 26ML (MISCELLANEOUS) ×3 IMPLANT
DERMABOND ADVANCED (GAUZE/BANDAGES/DRESSINGS) ×2
DERMABOND ADVANCED .7 DNX12 (GAUZE/BANDAGES/DRESSINGS) ×1 IMPLANT
GLOVE BIO SURGEON STRL SZ7 (GLOVE) ×11 IMPLANT
GLOVE INDICATOR 7.5 STRL GRN (GLOVE) ×9 IMPLANT
GOWN STRL REUS W/ TWL LRG LVL3 (GOWN DISPOSABLE) ×2 IMPLANT
GOWN STRL REUS W/ TWL XL LVL3 (GOWN DISPOSABLE) IMPLANT
GOWN STRL REUS W/TWL LRG LVL3 (GOWN DISPOSABLE) ×12
GOWN STRL REUS W/TWL XL LVL3 (GOWN DISPOSABLE)
GRASPER SUT TROCAR 14GX15 (MISCELLANEOUS) ×3 IMPLANT
IRRIGATION STRYKERFLOW (MISCELLANEOUS) IMPLANT
IRRIGATOR STRYKERFLOW (MISCELLANEOUS) ×3
IV LACTATED RINGERS 1000ML (IV SOLUTION) ×3 IMPLANT
KIT RM TURNOVER CYSTO AR (KITS) ×3 IMPLANT
LABEL OR SOLS (LABEL) ×1 IMPLANT
NEEDLE HYPO 22GX1.5 SAFETY (NEEDLE) ×2 IMPLANT
NS IRRIG 500ML POUR BTL (IV SOLUTION) ×3 IMPLANT
PACK GYN LAPAROSCOPIC (MISCELLANEOUS) ×3 IMPLANT
PAD OB MATERNITY 4.3X12.25 (PERSONAL CARE ITEMS) ×3 IMPLANT
PAD PREP 24X41 OB/GYN DISP (PERSONAL CARE ITEMS) ×3 IMPLANT
SCISSORS METZENBAUM CVD 33 (INSTRUMENTS) IMPLANT
SHEARS HARMONIC ACE PLUS 36CM (ENDOMECHANICALS) ×3 IMPLANT
SLEEVE ENDOPATH XCEL 5M (ENDOMECHANICALS) ×3 IMPLANT
SUT MNCRL AB 4-0 PS2 18 (SUTURE) ×3 IMPLANT
SUT VIC AB 2-0 UR6 27 (SUTURE) ×5 IMPLANT
TROCAR ENDO BLADELESS 11MM (ENDOMECHANICALS) ×3 IMPLANT
TROCAR XCEL NON-BLD 5MMX100MML (ENDOMECHANICALS) ×3 IMPLANT
TUBING INSUFFLATOR HI FLOW (MISCELLANEOUS) ×3 IMPLANT

## 2016-11-23 NOTE — Anesthesia Preprocedure Evaluation (Signed)
Anesthesia Evaluation  Patient identified by MRN, date of birth, ID band Patient awake    Reviewed: Allergy & Precautions, NPO status , Patient's Chart, lab work & pertinent test results  Airway Mallampati: II  TM Distance: >3 FB     Dental   Pulmonary asthma , sleep apnea , Current Smoker,    Pulmonary exam normal        Cardiovascular hypertension, Normal cardiovascular exam     Neuro/Psych  Headaches, Anxiety    GI/Hepatic negative GI ROS, Neg liver ROS,   Endo/Other  negative endocrine ROS  Renal/GU negative Renal ROS  Female GU complaint     Musculoskeletal negative musculoskeletal ROS (+)   Abdominal Normal abdominal exam  (+)   Peds negative pediatric ROS (+)  Hematology negative hematology ROS (+) anemia ,   Anesthesia Other Findings   Reproductive/Obstetrics                             Anesthesia Physical Anesthesia Plan  ASA: III  Anesthesia Plan: General   Post-op Pain Management:    Induction: Intravenous  PONV Risk Score and Plan: 3 and Ondansetron, Dexamethasone, Midazolam and Propofol infusion  Airway Management Planned: Oral ETT  Additional Equipment:   Intra-op Plan:   Post-operative Plan: Extubation in OR  Informed Consent: I have reviewed the patients History and Physical, chart, labs and discussed the procedure including the risks, benefits and alternatives for the proposed anesthesia with the patient or authorized representative who has indicated his/her understanding and acceptance.   Dental advisory given  Plan Discussed with: CRNA and Surgeon  Anesthesia Plan Comments:         Anesthesia Quick Evaluation

## 2016-11-23 NOTE — Discharge Instructions (Signed)
  AMBULATORY SURGERY  DISCHARGE INSTRUCTIONS   1) The drugs that you were given will stay in your system until tomorrow so for the next 24 hours you should not:  A) Drive an automobile B) Make any legal decisions C) Drink any alcoholic beverage   2) You may resume regular meals tomorrow.  Today it is better to start with liquids and gradually work up to solid foods.  You may eat anything you prefer, but it is better to start with liquids, then soup and crackers, and gradually work up to solid foods.   3) Please notify your doctor immediately if you have any unusual bleeding, trouble breathing, redness and pain at the surgery site, drainage, fever, or pain not relieved by medication.    4) Additional Instructions: TAKE A STOOL SOFTENER TWICE A DAY WHILE TAKING NARCOTIC PAIN MEDICINE TO PREVENT CONSTIPATION   Please contact your physician with any problems or Same Day Surgery at [phone], Monday through Friday 6 am to 4 pm, or [place] [facility] [place] Main number at [phone].   

## 2016-11-23 NOTE — H&P (Signed)
Date of Initial H&P: 11/01/2016  History reviewed, patient examined, no change in status, stable for surgery.

## 2016-11-23 NOTE — Anesthesia Postprocedure Evaluation (Signed)
Anesthesia Post Note  Patient: Tanya Cline  Procedure(s) Performed: Procedure(s) (LRB): LAPAROSCOPIC OOPHORECTOMY (Left)  Patient location during evaluation: PACU Anesthesia Type: General Level of consciousness: awake and alert and oriented Pain management: pain level controlled Vital Signs Assessment: post-procedure vital signs reviewed and stable Respiratory status: spontaneous breathing Cardiovascular status: blood pressure returned to baseline Anesthetic complications: no     Last Vitals:  Vitals:   11/23/16 1413 11/23/16 1513  BP: (!) 144/81 137/66  Pulse: 68 69  Resp: 18 16  Temp:    SpO2: 100%     Last Pain:  Vitals:   11/23/16 1513  TempSrc:   PainSc: 2                  Lanissa Cashen

## 2016-11-23 NOTE — Anesthesia Procedure Notes (Signed)
Procedure Name: Intubation Date/Time: 11/23/2016 11:30 AM Performed by: Allean Found Pre-anesthesia Checklist: Patient identified, Emergency Drugs available, Suction available, Patient being monitored and Timeout performed Patient Re-evaluated:Patient Re-evaluated prior to induction Oxygen Delivery Method: Circle system utilized Preoxygenation: Pre-oxygenation with 100% oxygen Induction Type: IV induction Ventilation: Mask ventilation without difficulty Laryngoscope Size: Mac and 3 Grade View: Grade II Tube type: Oral Tube size: 7.0 mm Number of attempts: 1 Airway Equipment and Method: Stylet Placement Confirmation: ETT inserted through vocal cords under direct vision,  positive ETCO2 and breath sounds checked- equal and bilateral Secured at: 21 cm Tube secured with: Tape Dental Injury: Teeth and Oropharynx as per pre-operative assessment  Comments: peridontal disease.  Gums bleed slightly

## 2016-11-23 NOTE — Op Note (Signed)
Preoperative Diagnosis: 1) 56 y.o. with complex left adnexal lesion  Postoperative Diagnosis: 1) 56 y.o. with complex left adnexal lesion 2) Pedunculated posterior fibroid  Operation Performed: Laparoscopic left oophorectomy  Indication: 56 y.o. [id number]  with imaging revealing complex left adnexal process, negative tumor markers but intermittent discomfort  Surgeon: Malachy Mood, MD  Anesthesia: General  Preoperative Antibiotics: 2g ancef  Estimated Blood Loss: minimal  IV Fluids: 643mL  Urine Output:: 48mL  Drains or Tubes: none  Implants: none  Specimens Removed: left ovary  Complications: none  Intraoperative Findings: Normal tubes, and right ovary.  Uterus with pedunculated 4-5cm posterior uterine fibroid, enlarged multicystic left ovary containing clear serous fluid  Patient Condition: stable  Procedure in Detail:  Patient was taken to the operating room where she was administered general anesthesia.  She was positioned in the dorsal lithotomy position utilizing Allen stirups, prepped and draped in the usual sterile fashion.  Prior to proceeding with procedure a time out was performed.  Attention was turned to the patient's pelvis.  A red rubber catheter was used to empty the patient's bladder.  An operative speculum was placed to allow visualization of the cervix.  The anterior lip of the cervix was grasped with a single tooth tenaculum, and a Hulka tenaculum was placed to allow manipulation of the uterus.  The operative speculum and single tooth tenaculum were then removed.  Attention was turned to the patient's abdomen.  The umbilicus was infiltrated with 1% Sensorcaine, before making a stab incision using an 11 blade scalpel.  A 21mm Excel trocar was then used to gain direct entry into the peritoneal cavity utilizing the camera to visualize progress of the trocar during placement.  Once peritoneal entry had been achieved, insufflation was started and pneumoperitoneum  established at a pressure of 90mmHg. One additional 9mm excel trocar was placed under direct visualiztion in the left upper quadrant, one 37mm left lower quadrant trocar.General inspection of the abdomen revealed the above noted findings. The ovary was elevated out of the pelvis, the IP ligament was ligated and transected using a 71mm Harmonic, followed by the ovarian attachments to the mesosalpinx and utero-ovarian ligament.  The specimen was then placed in a laparoscopic bag, cysts were lysed in the bag to facilitate removal.  The skin incision was extended to approximately 3cm.  Following removal of the ovary the fascia was closed using a 0 Vicryl of a UR-6.  Following fascial closure the pneumoperitoneum was re-established and the port site closure was noted to be free of underlying bowl.    Pneumoperitoneum was evacuated.  The trocars were removed.  The 51mm trocar site was closed with 4-0 Monocryl in a subcuticular fashion.  All trocar sites were then dressed with surgical skin glue.  The Hulka tenaculum was removed.  Sponge needle and instrument counts were correct time two.  The patient tolerated the procedure well and was taken to the recovery room in stable condition.

## 2016-11-23 NOTE — Anesthesia Post-op Follow-up Note (Signed)
Anesthesia QCDR form completed.        

## 2016-11-23 NOTE — Transfer of Care (Signed)
Immediate Anesthesia Transfer of Care Note  Patient: Tanya Cline  Procedure(s) Performed: Procedure(s): LAPAROSCOPIC OOPHORECTOMY (Left)  Patient Location: PACU  Anesthesia Type:General  Level of Consciousness: sedated  Airway & Oxygen Therapy: Patient Spontanous Breathing  Post-op Assessment: Report given to RN and Post -op Vital signs reviewed and stable  Post vital signs: Reviewed and stable  Last Vitals:  Vitals:   11/23/16 0933  BP: (!) 147/93  Pulse: 89  Resp: 16  Temp: 36.9 C  SpO2: 100%    Last Pain:  Vitals:   11/23/16 0933  TempSrc: Oral         Complications: No apparent anesthesia complications

## 2016-11-25 LAB — SURGICAL PATHOLOGY

## 2016-11-29 ENCOUNTER — Ambulatory Visit (INDEPENDENT_AMBULATORY_CARE_PROVIDER_SITE_OTHER): Payer: BLUE CROSS/BLUE SHIELD | Admitting: Obstetrics and Gynecology

## 2016-11-29 VITALS — BP 130/88 | HR 100 | Wt 155.0 lb

## 2016-11-29 DIAGNOSIS — Z4889 Encounter for other specified surgical aftercare: Secondary | ICD-10-CM

## 2016-11-29 NOTE — Progress Notes (Signed)
      Postoperative Follow-up Patient presents post op from laparoscopic left oophorectomy 1weeks ago for serous cystadenoma.  Subjective: Patient reports some improvement in her preop symptoms. Eating a regular diet without difficulty. Pain is controlled without any medications.  Activity: normal activities of daily living.  Gen: NAD Pulmonary: no increased work of breathing Abdomen: soft, non-tender, non-distended, trocar sites healing well  Objective: Vitals:   11/29/16 1421  BP: 130/88  Pulse: 100     Assessment: 56 y.o. s/p laparoscopy left oophorectomy stable  Plan: Patient has done well after surgery with no apparent complications.  I have discussed the post-operative course to date, and the expected progress moving forward.  The patient understands what complications to be concerned about.  I will see the patient in routine follow up, or sooner if needed.    Activity plan: No heavy lifting.  Malachy Mood 11/29/2016, 9:16 PM

## 2016-12-16 ENCOUNTER — Telehealth: Payer: Self-pay

## 2016-12-16 NOTE — Telephone Encounter (Signed)
Another FMLA/DISABILITY form (3rd) filled out and given to [place] for processing.

## 2016-12-16 NOTE — Telephone Encounter (Signed)
FMLA/DISABILITY form for [employer] filled out and given to [place] for processing.

## 2016-12-28 ENCOUNTER — Encounter: Payer: Self-pay | Admitting: Obstetrics and Gynecology

## 2017-03-09 ENCOUNTER — Other Ambulatory Visit: Payer: Self-pay | Admitting: Obstetrics and Gynecology

## 2017-04-03 ENCOUNTER — Other Ambulatory Visit: Payer: Self-pay

## 2017-04-03 ENCOUNTER — Other Ambulatory Visit: Payer: Self-pay | Admitting: Obstetrics and Gynecology

## 2017-04-03 ENCOUNTER — Ambulatory Visit (INDEPENDENT_AMBULATORY_CARE_PROVIDER_SITE_OTHER): Payer: Self-pay

## 2017-04-03 ENCOUNTER — Ambulatory Visit
Admission: EM | Admit: 2017-04-03 | Discharge: 2017-04-03 | Disposition: A | Discharge status: Home / Self Care | Payer: Self-pay | Attending: Emergency Medicine | Admitting: Emergency Medicine

## 2017-04-03 DIAGNOSIS — R519 Headache, unspecified: Secondary | ICD-10-CM

## 2017-04-03 DIAGNOSIS — Z885 Allergy status to narcotic agent status: Secondary | ICD-10-CM | POA: Insufficient documentation

## 2017-04-03 DIAGNOSIS — Z7982 Long term (current) use of aspirin: Secondary | ICD-10-CM | POA: Insufficient documentation

## 2017-04-03 DIAGNOSIS — Z79899 Other long term (current) drug therapy: Secondary | ICD-10-CM | POA: Insufficient documentation

## 2017-04-03 DIAGNOSIS — R079 Chest pain, unspecified: Secondary | ICD-10-CM

## 2017-04-03 DIAGNOSIS — R0789 Other chest pain: Secondary | ICD-10-CM

## 2017-04-03 DIAGNOSIS — G4730 Sleep apnea, unspecified: Secondary | ICD-10-CM | POA: Insufficient documentation

## 2017-04-03 DIAGNOSIS — R51 Headache: Secondary | ICD-10-CM | POA: Insufficient documentation

## 2017-04-03 DIAGNOSIS — I10 Essential (primary) hypertension: Secondary | ICD-10-CM | POA: Insufficient documentation

## 2017-04-03 DIAGNOSIS — Z8249 Family history of ischemic heart disease and other diseases of the circulatory system: Secondary | ICD-10-CM | POA: Insufficient documentation

## 2017-04-03 DIAGNOSIS — F17210 Nicotine dependence, cigarettes, uncomplicated: Secondary | ICD-10-CM | POA: Insufficient documentation

## 2017-04-03 MED ORDER — IBUPROFEN 600 MG PO TABS: 600.0000 mg | ORAL_TABLET | Freq: Four times a day (QID) | ORAL | 0 refills | Status: DC | PRN

## 2017-04-03 NOTE — ED Triage Notes (Addendum)
Pt reports she awoke with a headache across forehead and between her eyes which has improved with Advil. Pt reports she also had a "strange feeling in her upper chest and into her throat".  She would like an EKG. Reports her pain is better now. Pt has neurology appt on Dec 27th for headaches.

## 2017-04-03 NOTE — ED Provider Notes (Signed)
HPI  SUBJECTIVE:  Tanya Cline is a 56 y.o. female who presents with 2 complaints.  First, her primary complaint is she reports upper bilateral chest pain located along the clavicles and at the base of her throat.  States it is dull, lasted 30 minutes today.  Occurred immediately prior to arrival.  Did not radiate to the side of her neck, down her arm or through to her back.  She denies nausea, diaphoresis, palpitations, chest pressure or heaviness.  No belching, water brash no presyncope, syncope.  It is worse with lying down and bending forward, better with Advil.  She has not tried anything else for this.  There is no exertional component to this whatsoever.  She denies abdominal pain.  She does report achy left calf pain several days ago, but states that this has since resolved.  No calf swelling, hemoptysis, prolonged immobilization, surgery in the past 4 weeks.  She has had similar calf Pain before and did not seek medical attention.  She denies any change in her physical activity.  The chest pain is not associated with arm movement or torso rotation.  States the pain has resolved.  Patient was seen here last year for left sided chest pain.  She was referred to the ED, where she had negative troponins and normal serial EKGs.  Referred to cardiology and states that she followed up.  States that she needs to follow-up with them again soon.  She canceled her most recent appointment.  Patient states that she has had a normal stress test in the past year.  Cardiologist, Dr. Kerman Passey  Second, reports waking up with a throbbing, frontal headache going across her head in a bandlike distribution which resolved after taking some ibuprofen.  There are no aggravating factors.  This is not the first or worst headache of her life.  She denies fevers, nasal congestion, jaw pain, dental pain.  No visual changes, sinus pain or pressure, ear pain, dysarthria, aphasia, arm or leg weakness, facial droop.   No phonophobia, photophobia, neck stiffness, rash.  She has had similar headaches before.  She has a past medical history of chronic headaches for which she has neurology follow-up on 12/27, hypertension.  She did not take her blood pressure medicines today.  Also past medical history of asthma, smoking, breast cancer.  History of GERD, MI, hypercholesterolemia, diabetes, HIV, exogenous estrogen, PE, DVT.  Family history significant for MI in aunts and her age 9s.  EXH:BZJIRC, Rubbie Battiest, MD   Past Medical History:  Diagnosis Date  . Anxiety state, unspecified   . Asthma    AGE 50  . Ectopic pregnancy   . Endometriosis   . Essential hypertension, benign   . Headache   . Other and unspecified ovarian cyst   . Other disorder of menstruation and other abnormal bleeding from female genital tract   . Sleep apnea    CAN NOT WEAR ALL NIGHT    Past Surgical History:  Procedure Laterality Date  . ECTOPIC PREGNANCY SURGERY    . OVARIAN CYST REMOVAL      Family History  Problem Relation Age of Onset  . Hypertension Mother   . Hypertension Father   . Hypertension Sister   . Heart disease Maternal Grandfather   . Hypertension Sister   . Cancer Paternal Uncle     Social History   Tobacco Use  . Smoking status: Current Every Day Smoker    Packs/day: 0.25    Years: 10.00  Pack years: 2.50    Types: Cigarettes  . Smokeless tobacco: Never Used  Substance Use Topics  . Alcohol use: Yes    Comment: socially  . Drug use: No    No current facility-administered medications for this encounter.   Current Outpatient Medications:  .  aspirin EC 81 MG tablet, Take 81 mg by mouth 2 (two) times a week. , Disp: , Rfl:  .  ibuprofen (ADVIL,MOTRIN) 600 MG tablet, Take 1 tablet (600 mg total) by mouth every 6 (six) hours as needed., Disp: 30 tablet, Rfl: 0 .  Multiple Vitamin (MULTIVITAMIN) tablet, Take 1 tablet by mouth daily., Disp: , Rfl:  .  ondansetron (ZOFRAN ODT) 4 MG disintegrating  tablet, Take 1 tablet (4 mg total) by mouth every 6 (six) hours as needed for nausea., Disp: 20 tablet, Rfl: 0 .  verapamil (CALAN-SR) 180 MG CR tablet, Take 180 mg by mouth daily., Disp: , Rfl: 0  Allergies  Allergen Reactions  . Codeine Nausea And Vomiting     ROS  As noted in HPI.   Physical Exam  BP (!) 145/104 (BP Location: Left Arm) Comment: hasn't taken B/P bill yet today  Pulse 76   Temp 97.7 F (36.5 C) (Oral)   Resp 16   Ht 5' 4.5" (1.638 m)   Wt 156 lb (70.8 kg)   LMP 06/04/2016   SpO2 100%   BMI 26.36 kg/m   Constitutional: Well developed, well nourished, no acute distress Eyes: PERRL, EOMI, conjunctiva normal bilaterally, no photophobia HENT: Normocephalic, atraumatic,mucus membranes moist.  No temporal artery tenderness.  No TMJ tenderness. Neck: No meningismus.  No trapezial tenderness. Respiratory: Clear to auscultation bilaterally, no rales, no wheezing, no rhonchi Cardiovascular: Normal rate and rhythm, no murmurs, no gallops, no rubs.  No chest wall tenderness.  Unable to reproduce chest pain. GI: Nondistended skin: No rash, skin intact calves symmetric, nontender.  No edema. Musculoskeletal: No edema, no tenderness, no deformities Neurologic: Alert & oriented x 3, CN II-XII intact, no motor deficits, sensation grossly intact.  Finger nose, heel shin within normal limits, tandem gait steady.  Romberg negative. Psychiatric: Speech and behavior appropriate   ED Course   Medications - No data to display  Orders Placed This Encounter  Procedures  . DG Chest 2 View    Standing Status:   Standing    Number of Occurrences:   1    Order Specific Question:   Reason for Exam (SYMPTOM  OR DIAGNOSIS REQUIRED)    Answer:   Chest pain  . ED EKG    Standing Status:   Standing    Number of Occurrences:   1    Order Specific Question:   Reason for Exam    Answer:   Chest Pain  . EKG 12-Lead    Standing Status:   Standing    Number of Occurrences:   1    No results found for this or any previous visit (from the past 24 hour(s)). Dg Chest 2 View  Result Date: 04/03/2017 CLINICAL DATA:  Mid chest and epigastric pain. EXAM: CHEST  2 VIEW COMPARISON:  03/31/2016 FINDINGS: The heart size and mediastinal contours are within normal limits. There is no evidence of pulmonary edema, consolidation, pneumothorax, nodule or pleural fluid. The visualized skeletal structures are unremarkable. IMPRESSION: No active cardiopulmonary disease. Electronically Signed   By: Aletta Edouard M.D.   On: 04/03/2017 11:05    ED Clinical Impression  Acute nonintractable headache, unspecified headache type  Chest pain, unspecified type   ED Assessment/Plan  EKG: Normal sinus rhythm, rate 72.  Normal axis, normal intervals.  No hypertrophy.  No change from EKG in December 2017.    Previous records reviewed as noted in HPI.  Patient states that she will not go to the ER even if I recommend doing so.  We will get a chest x-ray. She has a reassuring EKG.  Doubt PE in the absence of tachycardia, hypoxia.  Imaging independently reviewed.  Normal chest x-ray.  See radiology report for details.  1.  Chest pain.  Normal EKG, normal chest x-ray.  While it is not reproducible, feel the patient is low risk enough that we can observe this, especially since she has had an ER evaluation for this and a negative stress test  in the past year.   Gave her very strict ER return precautions.  She agrees with plan.  2.  Headache.  Most likely musculoskeletal versus tension.  No evidence of sinusitis, meningitis, stroke, temporal arteritis or glaucoma.  Will have her continue the ibuprofen for this, may add Tylenol.  She has follow-up with neurology on 12/27 for her chronic headaches.  Discussed imaging, MDM, plan and followup with patient Discussed sn/sx that should prompt return to the ED. patient agrees with plan.   Meds ordered this encounter  Medications  . ibuprofen  (ADVIL,MOTRIN) 600 MG tablet    Sig: Take 1 tablet (600 mg total) by mouth every 6 (six) hours as needed.    Dispense:  30 tablet    Refill:  0    *This clinic note was created using [employer]. Therefore, there may be occasional mistakes despite careful proofreading.  ?   Melynda Ripple, MD 04/03/17 [phone]

## 2017-04-03 NOTE — Discharge Instructions (Signed)
If your chest pain returns, taking aspirin 324 mg and go immediately to the emergency department.  You may take 1 g of Tylenol with 600 mg ibuprofen 3-4 times a day as needed for your headache.  Go to the ER if your headache gets worse, is not controlled with these medications or for any strokelike symptoms.

## 2017-04-27 ENCOUNTER — Telehealth: Payer: Self-pay

## 2017-04-27 ENCOUNTER — Other Ambulatory Visit: Payer: Self-pay | Admitting: Obstetrics and Gynecology

## 2017-04-27 NOTE — Telephone Encounter (Signed)
Pt requesting refill of Nausea med from [facility]. She states she is still experiencing nausea in the a.m. & sometimes in the p.m. Please send to pharmacy on file [place]. Cb#[phone]

## 2017-04-27 NOTE — Telephone Encounter (Signed)
Please advise 

## 2017-04-28 NOTE — Telephone Encounter (Signed)
LVM for patient to call me back 

## 2017-04-28 NOTE — Telephone Encounter (Signed)
Not appropriate if she is still having nausea >4 months for a laparoscopy it is not related to the laparoscopy and needs to be evaluted

## 2017-05-11 ENCOUNTER — Other Ambulatory Visit: Payer: Self-pay

## 2017-05-11 ENCOUNTER — Encounter: Payer: Self-pay | Admitting: Emergency Medicine

## 2017-05-11 ENCOUNTER — Ambulatory Visit
Admission: EM | Admit: 2017-05-11 | Discharge: 2017-05-11 | Disposition: A | Discharge status: Home / Self Care | Payer: BLUE CROSS/BLUE SHIELD | Attending: Family Medicine | Admitting: Family Medicine

## 2017-05-11 DIAGNOSIS — J069 Acute upper respiratory infection, unspecified: Secondary | ICD-10-CM

## 2017-05-11 DIAGNOSIS — B9789 Other viral agents as the cause of diseases classified elsewhere: Secondary | ICD-10-CM

## 2017-05-11 DIAGNOSIS — J0100 Acute maxillary sinusitis, unspecified: Secondary | ICD-10-CM

## 2017-05-11 MED ORDER — BENZONATATE 100 MG PO CAPS: 100.0000 mg | ORAL_CAPSULE | Freq: Three times a day (TID) | ORAL | 0 refills | Status: DC | PRN

## 2017-05-11 MED ORDER — FLUTICASONE PROPIONATE 50 MCG/ACT NA SUSP: 1.0000 | Freq: Every day | NASAL | 0 refills | Status: DC

## 2017-05-11 MED ORDER — AMOXICILLIN-POT CLAVULANATE 875-125 MG PO TABS: 1.0000 | ORAL_TABLET | Freq: Two times a day (BID) | ORAL | 0 refills | Status: DC

## 2017-05-11 MED ORDER — HYDROCOD POLST-CPM POLST ER 10-8 MG/5ML PO SUER: 5.0000 mL | Freq: Every evening | ORAL | 0 refills | Status: DC | PRN

## 2017-05-11 NOTE — ED Provider Notes (Signed)
[facility] ____________________________________________  Time seen: Approximately 4:10 PM  I have reviewed the triage vital signs and the nursing notes.   HISTORY  Chief Complaint Fever and Cough   HPI Tanya Cline is a 57 y.o. female presenting for evaluation of 6 days of runny nose, nasal congestion, cough and sinus pressure.  States having copious thick yellowish nasal drainage and feeling posterior drainage.  States cough is worse at night.  States today had low-grade fever of 100.  States has been taking intermittent over-the-counter ibuprofen.  States also unresolved with over-the-counter cough and decongestants.  Reports multiple sick contacts at work recently with the same complaints.  States within the first few days of sickness she had a fever as well, but reports fever then resolved and then returned today.  States biggest complaint is nasal congestion.  Denies sore throat.  Reports overall continues to eat and drink well.  Denies other complaints.Denies chest pain, shortness of breath, abdominal pain, or rash. Denies recent sickness. Denies recent antibiotic use.   Sharyne Peach, MD: PCP   Past Medical History:  Diagnosis Date  . Anxiety state, unspecified   . Asthma    AGE 13  . Ectopic pregnancy   . Endometriosis   . Essential hypertension, benign   . Headache   . Other and unspecified ovarian cyst   . Other disorder of menstruation and other abnormal bleeding from female genital tract   . Sleep apnea    CAN NOT WEAR ALL NIGHT    Patient Active Problem List   Diagnosis Date Noted  . Ovarian cyst, complex 05/26/2016  . Vulvar lesion 05/26/2016  . Symptomatic anemia 04/17/2016  . Menorrhagia 04/15/2016  . Palpitations 01/04/2012    Past Surgical History:  Procedure Laterality Date  . ECTOPIC PREGNANCY SURGERY    . OVARIAN CYST REMOVAL       No current facility-administered medications for this encounter.   Current  Outpatient Medications:  .  aspirin EC 81 MG tablet, Take 81 mg by mouth 2 (two) times a week. , Disp: , Rfl:  .  verapamil (CALAN-SR) 180 MG CR tablet, Take 180 mg by mouth daily., Disp: , Rfl: 0 .  amoxicillin-clavulanate (AUGMENTIN) 875-125 MG tablet, Take 1 tablet by mouth every 12 (twelve) hours., Disp: 20 tablet, Rfl: 0 .  benzonatate (TESSALON PERLES) 100 MG capsule, Take 1 capsule (100 mg total) by mouth 3 (three) times daily as needed for cough., Disp: 15 capsule, Rfl: 0 .  chlorpheniramine-HYDROcodone (TUSSIONEX PENNKINETIC ER) 10-8 MG/5ML SUER, Take 5 mLs by mouth at bedtime as needed for cough. do not drive or operate machinery while taking as can cause drowsiness., Disp: 75 mL, Rfl: 0 .  fluticasone (FLONASE) 50 MCG/ACT nasal spray, Place 1 spray into both nostrils daily., Disp: 1 g, Rfl: 0 .  Multiple Vitamin (MULTIVITAMIN) tablet, Take 1 tablet by mouth daily., Disp: , Rfl:   Allergies Codeine  Family History  Problem Relation Age of Onset  . Hypertension Mother   . Hypertension Father   . Hypertension Sister   . Heart disease Maternal Grandfather   . Hypertension Sister   . Cancer Paternal Uncle     Social History Social History   Tobacco Use  . Smoking status: Current Every Day Smoker    Packs/day: 0.25    Years: 10.00    Pack years: 2.50    Types: Cigarettes  . Smokeless tobacco: Never Used  Substance Use Topics  . Alcohol use:  Yes    Comment: socially  . Drug use: No    Review of Systems Constitutional: No fever/chills ENT: No sore throat. Cardiovascular: Denies chest pain. Respiratory: Denies shortness of breath. Gastrointestinal: No abdominal pain.  Musculoskeletal: Negative for back pain. Skin: Negative for rash.   ____________________________________________   PHYSICAL EXAM:  VITAL SIGNS: ED Triage Vitals  Enc Vitals Group     BP 05/11/17 1502 (!) 144/95     Pulse Rate 05/11/17 1502 (!) 117 Recheck 86     Resp 05/11/17 1502 20      Temp 05/11/17 1502 98 F (36.7 C)     Temp Source 05/11/17 1502 Oral     SpO2 05/11/17 1502 99 %     Weight 05/11/17 1504 150 lb (68 kg)     Height 05/11/17 1504 5' 4.5" (1.638 m)     Head Circumference --      Peak Flow --      Pain Score 05/11/17 1503 6     Pain Loc --      Pain Edu? --      Excl. in [place]? --     Constitutional: Alert and oriented. Well appearing and in no acute distress. Eyes: Conjunctivae are normal.  Head: Atraumatic.Mild to moderate tenderness to palpation bilateral maxillary sinuses. Mild frontal sinus tenderness. No swelling. No erythema.   Ears: no erythema, normal TMs bilaterally.   Nose: nasal congestion with bilateral nasal turbinate erythema and edema.   Mouth/Throat: Mucous membranes are moist.  Oropharynx non-erythematous.No tonsillar swelling or exudate.  Neck: No stridor.  No cervical spine tenderness to palpation. Hematological/Lymphatic/Immunilogical: No cervical lymphadenopathy. Cardiovascular: Normal rate, regular rhythm. Grossly normal heart sounds.  Good peripheral circulation. Respiratory: Normal respiratory effort.  No retractions.No wheezes, rales or rhonchi. Good air movement.  Occasional dry cough noted in room. Musculoskeletal:No cervical, thoracic or lumbar tenderness to palpation.  Neurologic:  Normal speech and language. No gross focal neurologic deficits are appreciated. No gait instability. Skin:  Skin is warm, dry and intact. No rash noted. Psychiatric: Mood and affect are normal. Speech and behavior are normal.  ___________________________________________   LABS (all labs ordered are listed, but only abnormal results are displayed)  Labs Reviewed - No data to display ____________________________________________   PROCEDURES Procedures    INITIAL IMPRESSION / ASSESSMENT AND PLAN / ED COURSE  Pertinent labs & imaging results that were available during my care of the patient were reviewed by me and considered in my medical  decision making (see chart for details).  Well-appearing patient.  No acute distress.  Suspect recent viral upper respiratory infection such as influenza.  Discussed supportive care with parent Ladona Ridgel, as needed Tussionex and Flonase.  Encourage rest, fluids, supportive care.  Rx given of Augmentin for if no symptom improvement in the next 2-3 days then to start, discussed reevaluation for worsening complaints.Discussed indication, risks and benefits of medications with patient.  States allergic to codeine, she states that she does not tolerate it well, but states tolerates hydrocodone fine.  Discussed follow up with Primary care physician this week. Discussed follow up and return parameters including no resolution or any worsening concerns. Patient verbalized understanding and agreed to plan.   ____________________________________________   FINAL CLINICAL IMPRESSION(S) / ED DIAGNOSES  Final diagnoses:  Viral URI with cough  Acute maxillary sinusitis, recurrence not specified     ED Discharge Orders        Ordered    chlorpheniramine-HYDROcodone (TUSSIONEX PENNKINETIC ER) 10-8 MG/5ML  SUER  At bedtime PRN     05/11/17 1603    benzonatate (TESSALON PERLES) 100 MG capsule  3 times daily PRN     05/11/17 1603    amoxicillin-clavulanate (AUGMENTIN) 875-125 MG tablet  Every 12 hours     05/11/17 1603    fluticasone (FLONASE) 50 MCG/ACT nasal spray  Daily     05/11/17 1603       Note: This dictation was prepared with Dragon dictation along with smaller phrase technology. Any transcriptional errors that result from this process are unintentional.         Marylene Land, NP 05/11/17 1617

## 2017-05-11 NOTE — ED Triage Notes (Addendum)
Patient in today c/o fever today (99.9), cough and nasal congestion x 5 days.

## 2017-05-11 NOTE — Discharge Instructions (Signed)
Take medication as prescribed. Rest. Drink plenty of fluids.  ° °Follow up with your primary care physician this week as needed. Return to Urgent care for new or worsening concerns.  ° °

## 2017-05-14 ENCOUNTER — Telehealth: Payer: Self-pay

## 2017-05-14 NOTE — Telephone Encounter (Signed)
Called to follow up with patient since visit here at [facility]. Patient instructed to call back with any questions or concerns. MAH  

## 2017-06-03 ENCOUNTER — Ambulatory Visit: Payer: Self-pay | Admitting: Obstetrics and Gynecology

## 2017-06-27 ENCOUNTER — Other Ambulatory Visit: Payer: Self-pay | Admitting: Gastroenterology

## 2017-06-27 DIAGNOSIS — R7989 Other specified abnormal findings of blood chemistry: Secondary | ICD-10-CM

## 2017-06-27 DIAGNOSIS — R945 Abnormal results of liver function studies: Principal | ICD-10-CM

## 2017-07-04 ENCOUNTER — Ambulatory Visit: Payer: BLUE CROSS/BLUE SHIELD

## 2017-07-11 ENCOUNTER — Encounter (INDEPENDENT_AMBULATORY_CARE_PROVIDER_SITE_OTHER): Payer: Self-pay

## 2017-07-11 ENCOUNTER — Ambulatory Visit
Admission: RE | Admit: 2017-07-11 | Discharge: 2017-07-11 | Disposition: A | Discharge status: Home / Self Care | Payer: BLUE CROSS/BLUE SHIELD | Source: Ambulatory Visit | Attending: Gastroenterology | Admitting: Gastroenterology

## 2017-07-11 DIAGNOSIS — R7989 Other specified abnormal findings of blood chemistry: Secondary | ICD-10-CM

## 2017-07-11 DIAGNOSIS — N281 Cyst of kidney, acquired: Secondary | ICD-10-CM | POA: Diagnosis not present

## 2017-07-11 DIAGNOSIS — R945 Abnormal results of liver function studies: Secondary | ICD-10-CM | POA: Insufficient documentation

## 2017-07-11 DIAGNOSIS — K760 Fatty (change of) liver, not elsewhere classified: Secondary | ICD-10-CM | POA: Insufficient documentation

## 2017-07-21 ENCOUNTER — Encounter: Payer: Self-pay | Admitting: Student

## 2017-07-22 ENCOUNTER — Ambulatory Visit: Payer: BLUE CROSS/BLUE SHIELD | Admitting: Certified Registered Nurse Anesthetist

## 2017-07-22 ENCOUNTER — Encounter: Payer: Self-pay | Admitting: *Deleted

## 2017-07-22 ENCOUNTER — Ambulatory Visit
Admission: RE | Admit: 2017-07-22 | Discharge: 2017-07-22 | Disposition: A | Discharge status: Home / Self Care | Payer: BLUE CROSS/BLUE SHIELD | Source: Ambulatory Visit | Attending: Gastroenterology | Admitting: Gastroenterology

## 2017-07-22 ENCOUNTER — Ambulatory Visit: Payer: Self-pay | Admitting: Obstetrics and Gynecology

## 2017-07-22 ENCOUNTER — Encounter
Admission: RE | Disposition: A | Discharge status: Home / Self Care | Payer: Self-pay | Source: Ambulatory Visit | Attending: Gastroenterology

## 2017-07-22 ENCOUNTER — Other Ambulatory Visit: Payer: Self-pay

## 2017-07-22 DIAGNOSIS — G4730 Sleep apnea, unspecified: Secondary | ICD-10-CM | POA: Diagnosis not present

## 2017-07-22 DIAGNOSIS — F17200 Nicotine dependence, unspecified, uncomplicated: Secondary | ICD-10-CM | POA: Insufficient documentation

## 2017-07-22 DIAGNOSIS — K635 Polyp of colon: Secondary | ICD-10-CM | POA: Insufficient documentation

## 2017-07-22 DIAGNOSIS — Z1211 Encounter for screening for malignant neoplasm of colon: Secondary | ICD-10-CM | POA: Diagnosis not present

## 2017-07-22 DIAGNOSIS — I10 Essential (primary) hypertension: Secondary | ICD-10-CM | POA: Insufficient documentation

## 2017-07-22 DIAGNOSIS — Z79899 Other long term (current) drug therapy: Secondary | ICD-10-CM | POA: Insufficient documentation

## 2017-07-22 DIAGNOSIS — J449 Chronic obstructive pulmonary disease, unspecified: Secondary | ICD-10-CM | POA: Diagnosis not present

## 2017-07-22 DIAGNOSIS — Z7982 Long term (current) use of aspirin: Secondary | ICD-10-CM | POA: Diagnosis not present

## 2017-07-22 DIAGNOSIS — K5730 Diverticulosis of large intestine without perforation or abscess without bleeding: Secondary | ICD-10-CM | POA: Diagnosis not present

## 2017-07-22 DIAGNOSIS — F419 Anxiety disorder, unspecified: Secondary | ICD-10-CM | POA: Diagnosis not present

## 2017-07-22 DIAGNOSIS — Z7951 Long term (current) use of inhaled steroids: Secondary | ICD-10-CM | POA: Insufficient documentation

## 2017-07-22 HISTORY — PX: COLONOSCOPY WITH PROPOFOL: SHX5780

## 2017-07-22 SURGERY — COLONOSCOPY WITH PROPOFOL
Anesthesia: General

## 2017-07-22 MED ORDER — LIDOCAINE HCL (CARDIAC) 20 MG/ML IV SOLN
INTRAVENOUS | Status: DC | PRN
  Administered 2017-07-22: 50 mg via INTRAVENOUS

## 2017-07-22 MED ORDER — PROPOFOL 10 MG/ML IV BOLUS
INTRAVENOUS | Status: DC | PRN
  Administered 2017-07-22: 17 mg via INTRAVENOUS
  Administered 2017-07-22: 90 mg via INTRAVENOUS
  Administered 2017-07-22: 17 mg via INTRAVENOUS

## 2017-07-22 MED ORDER — SODIUM CHLORIDE 0.9 % IV SOLN: INTRAVENOUS | Status: DC

## 2017-07-22 MED ORDER — SODIUM CHLORIDE 0.9 % IV SOLN
INTRAVENOUS | Status: DC
  Administered 2017-07-22: 12:00:00 via INTRAVENOUS

## 2017-07-22 MED ORDER — LIDOCAINE HCL (PF) 2 % IJ SOLN
INTRAMUSCULAR | Status: AC
  Filled 2017-07-22: qty 10

## 2017-07-22 MED ORDER — PROPOFOL 500 MG/50ML IV EMUL
INTRAVENOUS | Status: AC
  Filled 2017-07-22: qty 50

## 2017-07-22 MED ORDER — PROPOFOL 500 MG/50ML IV EMUL
INTRAVENOUS | Status: DC | PRN
  Administered 2017-07-22: 140 ug/kg/min via INTRAVENOUS

## 2017-07-22 NOTE — Anesthesia Postprocedure Evaluation (Signed)
Anesthesia Post Note  Patient: Tanya Cline  Procedure(s) Performed: COLONOSCOPY WITH PROPOFOL (N/A )  Patient location during evaluation: Endoscopy Anesthesia Type: General Level of consciousness: awake and alert Pain management: pain level controlled Vital Signs Assessment: post-procedure vital signs reviewed and stable Respiratory status: spontaneous breathing, nonlabored ventilation, respiratory function stable and patient connected to nasal cannula oxygen Cardiovascular status: blood pressure returned to baseline and stable Postop Assessment: no apparent nausea or vomiting Anesthetic complications: no     Last Vitals:  Vitals:   07/22/17 1301 07/22/17 1311  BP: (!) 126/99 (!) 147/90  Pulse: 71 83  Resp: 12 14  Temp:    SpO2: 100% 100%    Last Pain:  Vitals:   07/22/17 1311  TempSrc:   PainSc: 0-No pain                 Precious Haws Piscitello

## 2017-07-22 NOTE — Op Note (Signed)
[facility] Gastroenterology Patient Name: Tanya Cline Procedure Date: 07/22/2017 11:19 AM MRN: [id number] Account #: [id number] Date of Birth: 1960-06-01 Admit Type: Outpatient Age: 57 Room: [facility] ENDO ROOM 3 Gender: Female Note Status: Finalized Procedure:            Colonoscopy Indications:          Screening for colorectal malignant neoplasm Providers:            Lollie Sails, MD Referring MD:         Rubbie Battiest. Iona Beard MD, MD (Referring MD) Medicines:            Monitored Anesthesia Care Complications:        No immediate complications. Procedure:            Pre-Anesthesia Assessment:                       - ASA Grade Assessment: III - A patient with severe                        systemic disease.                       After obtaining informed consent, the colonoscope was                        passed under direct vision. Throughout the procedure,                        the patient's blood pressure, pulse, and oxygen                        saturations were monitored continuously. The                        Colonoscope was introduced through the anus and                        advanced to the the cecum, identified by appendiceal                        orifice and ileocecal valve. The colonoscopy was                        performed without difficulty. The patient tolerated the                        procedure well. The quality of the bowel preparation                        was good. Findings:      A less than 1 mm polyp was found in the distal sigmoid colon. The polyp       was sessile. The polyp was removed with a cold biopsy forceps. Resection       and retrieval were complete.      A few small-mouthed diverticula were found in the sigmoid colon.      The retroflexed view of the distal rectum and anal verge was normal and       showed no anal or rectal abnormalities.      The digital rectal exam was normal. Impression:           -  One less than  1 mm polyp in the distal sigmoid colon,                        removed with a cold biopsy forceps. Resected and                        retrieved.                       - Diverticulosis in the sigmoid colon.                       - The distal rectum and anal verge are normal on                        retroflexion view. Recommendation:       - Discharge patient to home.                       - Await pathology results.                       - Telephone GI clinic for pathology results in 1 week. Procedure Code(s):    --- Professional ---                       [phone], Colonoscopy, flexible; with biopsy, single or                        multiple Diagnosis Code(s):    --- Professional ---                       Z12.11, Encounter for screening for malignant neoplasm                        of colon                       D12.5, Benign neoplasm of sigmoid colon                       K57.30, Diverticulosis of large intestine without                        perforation or abscess without bleeding CPT copyright 2017 [employer]. All rights reserved. The codes documented in this report are preliminary and upon coder review may  be revised to meet current compliance requirements. Lollie Sails, MD 07/22/2017 12:37:43 PM This report has been signed electronically. Number of Addenda: 0 Note Initiated On: 07/22/2017 11:19 AM Scope Withdrawal Time: 0 hours 8 minutes 8 seconds  Total Procedure Duration: 0 hours 19 minutes 23 seconds       [facility]

## 2017-07-22 NOTE — H&P (Signed)
Outpatient short stay form Pre-procedure 07/22/2017 11:42 AM Tanya Sails MD  Primary Physician: Dr Salome Holmes  Reason for visit: Colonoscopy  History of present illness: Tanya Cline is a 57 year old female presenting today as above.  This is for colon cancer screening.  She has no diarrhea blood in her stools or abdominal pain.  She tolerated her prep well.  She takes a 81 mg aspirin twice a week but no other aspirin products or blood thinning agent.    Current Facility-Administered Medications:  .  0.9 %  sodium chloride infusion, , Intravenous, Continuous, Tanya Sails, MD .  0.9 %  sodium chloride infusion, , Intravenous, Continuous, Tanya Sails, MD  Medications Prior to Admission  Medication Sig Dispense Refill Last Dose  . aspirin EC 81 MG tablet Take 81 mg by mouth 2 (two) times a week.    Past Week at Unknown time  . fluticasone (FLONASE) 50 MCG/ACT nasal spray Place 1 spray into both nostrils daily. 1 g 0 Past Month at Unknown time  . ketotifen (ZADITOR) 0.025 % ophthalmic solution 1 drop 2 (two) times daily.   Past Week at Unknown time  . Multiple Vitamin (MULTIVITAMIN) tablet Take 1 tablet by mouth daily.   Past Week at Unknown time  . verapamil (CALAN-SR) 180 MG CR tablet Take 180 mg by mouth daily.  0 07/22/2017 at 0930  . amoxicillin-clavulanate (AUGMENTIN) 875-125 MG tablet Take 1 tablet by mouth every 12 (twelve) hours. (Tanya Cline not taking: Reported on 07/22/2017) 20 tablet 0 Completed Course at Unknown time  . benzonatate (TESSALON PERLES) 100 MG capsule Take 1 capsule (100 mg total) by mouth 3 (three) times daily as needed for cough. (Tanya Cline not taking: Reported on 07/22/2017) 15 capsule 0 Not Taking at Unknown time  . chlorpheniramine-HYDROcodone (TUSSIONEX PENNKINETIC ER) 10-8 MG/5ML SUER Take 5 mLs by mouth at bedtime as needed for cough. do not drive or operate machinery while taking as can cause drowsiness. (Tanya Cline not taking: Reported on 07/22/2017) 75  mL 0 Not Taking at Unknown time  . clonazePAM (KLONOPIN) 0.5 MG tablet Take 0.5 mg by mouth 2 (two) times daily as needed for anxiety.   Not Taking at Unknown time     Allergies  Allergen Reactions  . Codeine Nausea And Vomiting     Past Medical History:  Diagnosis Date  . Anxiety state, unspecified   . Asthma    AGE 63  . Ectopic pregnancy   . Endometriosis   . Essential hypertension, benign   . Headache   . Other and unspecified ovarian cyst   . Other disorder of menstruation and other abnormal bleeding from female genital tract   . Sleep apnea    CAN NOT WEAR ALL NIGHT    Review of systems:      Physical Exam    Heart and lungs: Regular rate and rhythm without rub or gallop, lungs are bilaterally clear.    HEENT: Normocephalic atraumatic eyes are anicteric    Other:    Pertinant exam for procedure: Soft nontender nondistended bowel sounds positive normoactive    Planned proceedures: Colonoscopy and indicated procedures. I have discussed the risks benefits and complications of procedures to include not limited to bleeding, infection, perforation and the risk of sedation and the Tanya Cline wishes to proceed.    Tanya Sails, MD Gastroenterology 07/22/2017  11:42 AM

## 2017-07-22 NOTE — Anesthesia Post-op Follow-up Note (Signed)
Anesthesia QCDR form completed.        

## 2017-07-22 NOTE — Anesthesia Preprocedure Evaluation (Signed)
Anesthesia Evaluation  Patient identified by MRN, date of birth, ID band Patient awake    Reviewed: Allergy & Precautions, H&P , NPO status , Patient's Chart, lab work & pertinent test results  Airway Mallampati: II  TM Distance: >3 FB Neck ROM: full    Dental  (+) Chipped, Poor Dentition, Missing, Partial Upper   Pulmonary asthma , sleep apnea , COPD, Current Smoker,           Cardiovascular Exercise Tolerance: Good hypertension, (-) angina(-) Past MI and (-) DOE      Neuro/Psych  Headaches, PSYCHIATRIC DISORDERS Anxiety    GI/Hepatic negative GI ROS, Neg liver ROS, neg GERD  ,  Endo/Other  negative endocrine ROS  Renal/GU negative Renal ROS  negative genitourinary   Musculoskeletal   Abdominal   Peds  Hematology negative hematology ROS (+)   Anesthesia Other Findings Past Medical History: No date: Anxiety state, unspecified No date: Asthma     Comment:  AGE 57 No date: Ectopic pregnancy No date: Endometriosis No date: Essential hypertension, benign No date: Headache No date: Other and unspecified ovarian cyst No date: Other disorder of menstruation and other abnormal bleeding  from female genital tract No date: Sleep apnea     Comment:  CAN NOT WEAR ALL NIGHT  Past Surgical History: No date: ECTOPIC PREGNANCY SURGERY No date: OVARIAN CYST REMOVAL  BMI    Body Mass Index:  26.09 kg/m      Reproductive/Obstetrics negative OB ROS                             Anesthesia Physical Anesthesia Plan  ASA: III  Anesthesia Plan: General   Post-op Pain Management:    Induction: Intravenous  PONV Risk Score and Plan: Propofol infusion and TIVA  Airway Management Planned: Natural Airway and Nasal Cannula  Additional Equipment:   Intra-op Plan:   Post-operative Plan:   Informed Consent: I have reviewed the patients History and Physical, chart, labs and discussed the  procedure including the risks, benefits and alternatives for the proposed anesthesia with the patient or authorized representative who has indicated his/her understanding and acceptance.   Dental Advisory Given  Plan Discussed with: Anesthesiologist, CRNA and Surgeon  Anesthesia Plan Comments: (Patient consented for risks of anesthesia including but not limited to:  - adverse reactions to medications - risk of intubation if required - damage to teeth, lips or other oral mucosa - sore throat or hoarseness - Damage to heart, brain, lungs or loss of life  Patient voiced understanding.)        Anesthesia Quick Evaluation

## 2017-07-22 NOTE — Transfer of Care (Signed)
Immediate Anesthesia Transfer of Care Note  Patient: Tanya Cline  Procedure(s) Performed: COLONOSCOPY WITH PROPOFOL (N/A )  Patient Location: PACU and Endoscopy Unit  Anesthesia Type:General  Level of Consciousness: drowsy  Airway & Oxygen Therapy: Patient Spontanous Breathing and Patient connected to nasal cannula oxygen  Post-op Assessment: Report given to RN and Post -op Vital signs reviewed and stable  Post vital signs: Reviewed and stable  Last Vitals:  Vitals Value Taken Time  BP    Temp    Pulse    Resp    SpO2      Last Pain:  Vitals:   07/22/17 1139  TempSrc: Tympanic  PainSc: 0-No pain         Complications: No apparent anesthesia complications

## 2017-07-26 LAB — SURGICAL PATHOLOGY

## 2017-08-18 ENCOUNTER — Encounter: Payer: Self-pay | Admitting: Gynecology

## 2017-08-18 ENCOUNTER — Ambulatory Visit
Admission: EM | Admit: 2017-08-18 | Discharge: 2017-08-18 | Disposition: A | Discharge status: Home / Self Care | Payer: BLUE CROSS/BLUE SHIELD | Attending: Family Medicine | Admitting: Family Medicine

## 2017-08-18 ENCOUNTER — Other Ambulatory Visit: Payer: Self-pay

## 2017-08-18 DIAGNOSIS — R51 Headache: Secondary | ICD-10-CM

## 2017-08-18 DIAGNOSIS — R42 Dizziness and giddiness: Secondary | ICD-10-CM

## 2017-08-18 MED ORDER — MECLIZINE HCL 25 MG PO TABS: 25.0000 mg | ORAL_TABLET | Freq: Three times a day (TID) | ORAL | 0 refills | Status: AC | PRN

## 2017-08-18 NOTE — Discharge Instructions (Signed)
Medication as prescribed.  If persists, contact [facility].  Take care  Dr. Lacinda Axon

## 2017-08-18 NOTE — ED Triage Notes (Signed)
Per patient facial pressure / headache. Per patient notice x 2 days ago red spot in left eye.

## 2017-08-18 NOTE — ED Provider Notes (Signed)
[facility]    CSN: [id number] Arrival date & time: 08/18/17  1908  History   Chief Complaint Chief Complaint  Patient presents with  . Headache  . Dizziness   HPI  57 year old female presents with dizziness.  Patient reports ongoing dizziness for the past few days.  She states that it is difficult to describe.  She has difficulty describing it to me.  She states that she feels "dizzy.  She states that she feels like something is "off in her head".  She reports some pressure in the frontal region.  She has no respiratory symptoms.  No known exacerbating or relieving factors.  Patient is concerned about the cause of her symptoms.  No other associated symptoms.  No other complaints or concerns at this time.  Of note, when taking her history, patient requested an EKG to make sure she is not having a heart attack.  Patient also states that she thinks she needs a head CT per her daughter.  Past Medical History:  Diagnosis Date  . Anxiety state, unspecified   . Asthma    AGE 66  . Ectopic pregnancy   . Endometriosis   . Essential hypertension, benign   . Headache   . Other and unspecified ovarian cyst   . Other disorder of menstruation and other abnormal bleeding from female genital tract   . Sleep apnea    CAN NOT WEAR ALL NIGHT    Patient Active Problem List   Diagnosis Date Noted  . Ovarian cyst, complex 05/26/2016  . Vulvar lesion 05/26/2016  . Symptomatic anemia 04/17/2016  . Menorrhagia 04/15/2016  . Palpitations 01/04/2012    Past Surgical History:  Procedure Laterality Date  . COLONOSCOPY WITH PROPOFOL N/A 07/22/2017   Procedure: COLONOSCOPY WITH PROPOFOL;  Surgeon: Lollie Sails, MD;  Location: [facility] ENDOSCOPY;  Service: Endoscopy;  Laterality: N/A;  . ECTOPIC PREGNANCY SURGERY    . OVARIAN CYST REMOVAL      OB History    Gravida  6   Para  3   Term      Preterm      AB  3   Living  3     SAB  2   TAB      Ectopic  1   Multiple       Live Births               Home Medications    Prior to Admission medications   Medication Sig Start Date End Date Taking? Authorizing Provider  aspirin EC 81 MG tablet Take 81 mg by mouth 2 (two) times a week.    Yes [provider]  Multiple Vitamin (MULTIVITAMIN) tablet Take 1 tablet by mouth daily.   Yes [provider]  verapamil (CALAN-SR) 180 MG CR tablet Take 180 mg by mouth daily. 09/29/16  Yes [provider]  clonazePAM (KLONOPIN) 0.5 MG tablet Take 0.5 mg by mouth 2 (two) times daily as needed for anxiety.    [provider]  meclizine (ANTIVERT) 25 MG tablet Take 1 tablet (25 mg total) by mouth 3 (three) times daily as needed for dizziness. 08/18/17   Coral Spikes, DO    Family History Family History  Problem Relation Age of Onset  . Hypertension Mother   . Hypertension Father   . Hypertension Sister   . Heart disease Maternal Grandfather   . Hypertension Sister   . Cancer Paternal Uncle     Social History  Social History   Tobacco Use  . Smoking status: Current Every Day Smoker    Packs/day: 0.25    Years: 10.00    Pack years: 2.50    Types: Cigarettes  . Smokeless tobacco: Never Used  Substance Use Topics  . Alcohol use: Not Currently  . Drug use: No     Allergies   Codeine   Review of Systems Review of Systems  Respiratory: Negative.   Cardiovascular: Negative.   Neurological: Positive for dizziness and headaches.   Physical Exam Triage Vital Signs ED Triage Vitals  Enc Vitals Group     BP 08/18/17 1928 (!) 148/103     Pulse Rate 08/18/17 1928 99     Resp 08/18/17 1928 16     Temp 08/18/17 1928 98.6 F (37 C)     Temp Source 08/18/17 1928 Oral     SpO2 08/18/17 1928 98 %     Weight 08/18/17 1927 152 lb (68.9 kg)     Height --      Head Circumference --      Peak Flow --      Pain Score 08/18/17 1927 2     Pain Loc --      Pain Edu? --      Excl. in [place]? --    Updated Vital Signs BP (!)  148/103 (BP Location: Left Arm)   Pulse 99   Temp 98.6 F (37 C) (Oral)   Resp 16   Wt 152 lb (68.9 kg)   LMP 06/04/2016   SpO2 98%   BMI 26.09 kg/m   Physical Exam  Constitutional: She is oriented to person, place, and time. She appears well-developed. No distress.  HENT:  Head: Normocephalic and atraumatic.  Mouth/Throat: Oropharynx is clear and moist.  Normal TMs bilaterally.  Cardiovascular: Normal rate and regular rhythm.  Pulmonary/Chest: Effort normal and breath sounds normal. She has no wheezes. She has no rales.  Neurological: She is alert and oriented to person, place, and time.  Psychiatric: She has a normal mood and affect. Her behavior is normal.  Nursing note and vitals reviewed.  UC Treatments / Results  Labs (all labs ordered are listed, but only abnormal results are displayed) Labs Reviewed - No data to display  EKG None  Radiology No results found.  Procedures Procedures (including critical care time)  Medications Ordered in UC Medications - No data to display  Initial Impression / Assessment and Plan / UC Course  I have reviewed the triage vital signs and the nursing notes.  Pertinent labs & imaging results that were available during my care of the patient were reviewed by me and considered in my medical decision making (see chart for details).    57 year old female presents with vague dizziness.  Her exam is unremarkable.  Unclear etiology.  Patient does not need requested EKG and imaging at this time.  Trial of meclizine.  Supportive care.  Final Clinical Impressions(s) / UC Diagnoses   Final diagnoses:  Dizziness     Discharge Instructions     Medication as prescribed.  If persists, contact [facility].  Take care  Dr. Lacinda Axon    ED Prescriptions    Medication Sig Dispense Auth. Provider   meclizine (ANTIVERT) 25 MG tablet Take 1 tablet (25 mg total) by mouth 3 (three) times daily as needed for dizziness. 30 tablet Coral Spikes,  DO     Controlled Substance Prescriptions [place] Controlled Substance Registry consulted? Not Applicable  Coral Spikes, [place] 08/18/17 2049

## 2018-01-16 IMAGING — CR DG CHEST 2V
2 series · 2 of 2 positions shown · non-contrast
Comparison: 03/31/2016

CLINICAL DATA: Mid chest and epigastric pain.

EXAM:
CHEST  2 VIEW

[chest pa]
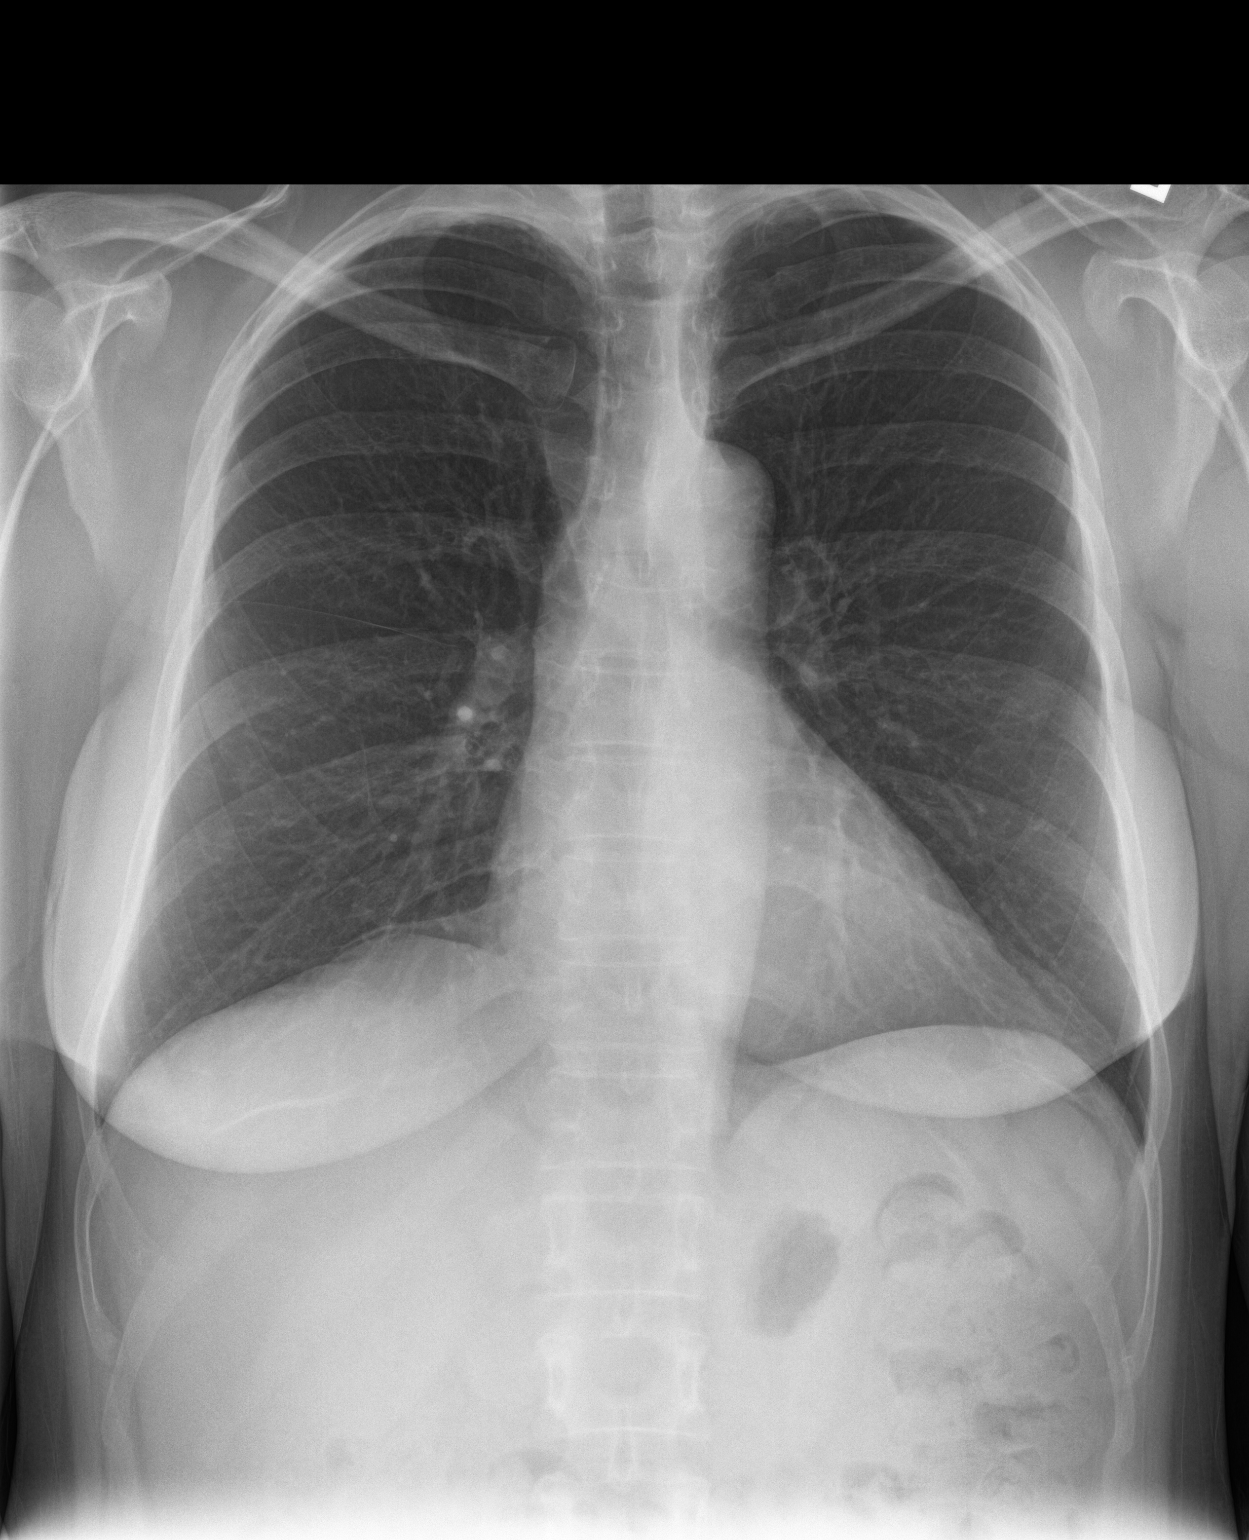

[chest lat]
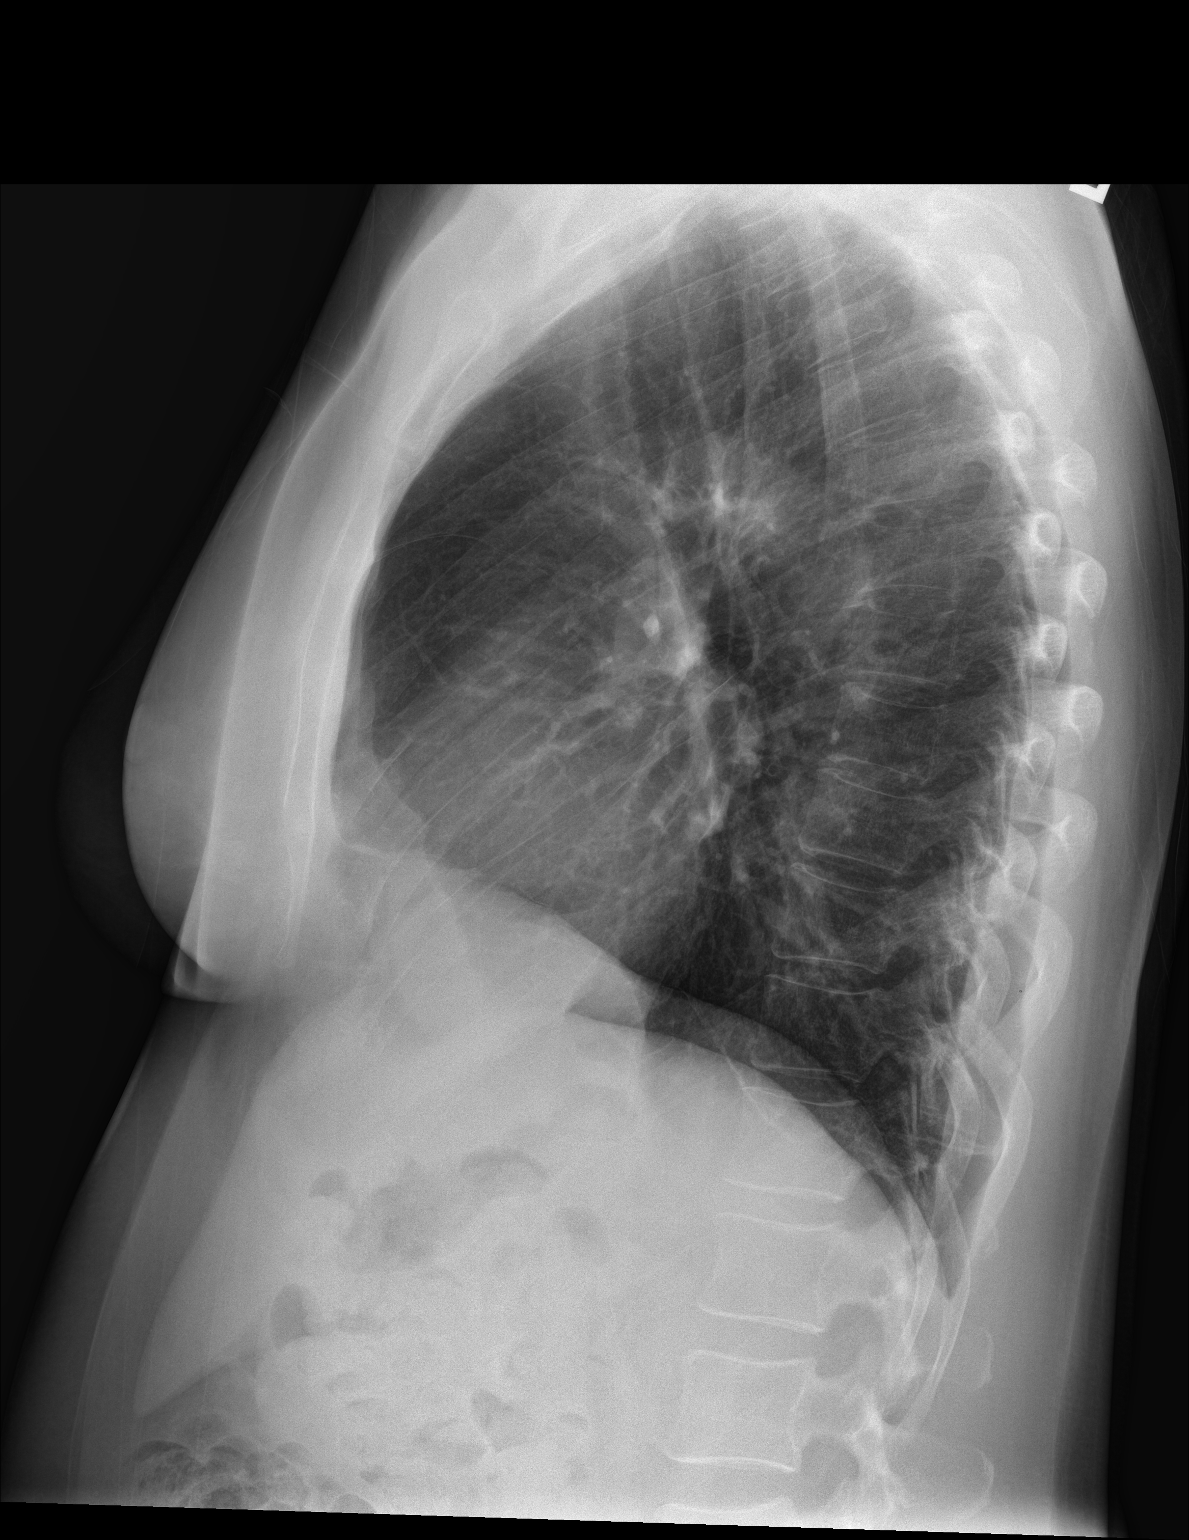

[2 of 2 positions shown; findings below may reference images not displayed]

FINDINGS: The heart size and mediastinal contours are within normal limits.
There is no evidence of pulmonary edema, consolidation,
pneumothorax, nodule or pleural fluid. The visualized skeletal
structures are unremarkable.
IMPRESSION: No active cardiopulmonary disease.

## 2018-09-11 IMAGING — US US ABDOMEN COMPLETE
1 series · 13 of 25 positions shown · non-contrast
Comparison: CT Abdomen and Pelvis 08/23/2009

CLINICAL DATA: 56-year-old female with abnormal LFTs.

EXAM:
ABDOMEN ULTRASOUND COMPLETE

[Series 1: us abdomen complete · 0.20mm/px · 13 of 134 slices shown]
[im 1/134]
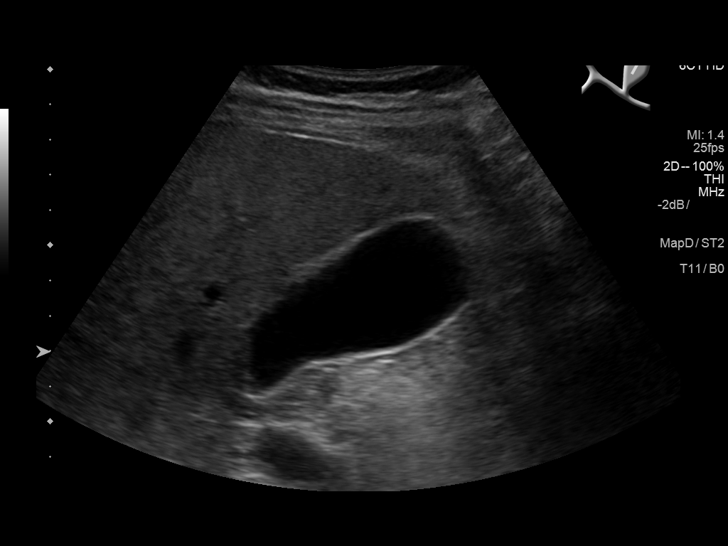
[im 12/134]
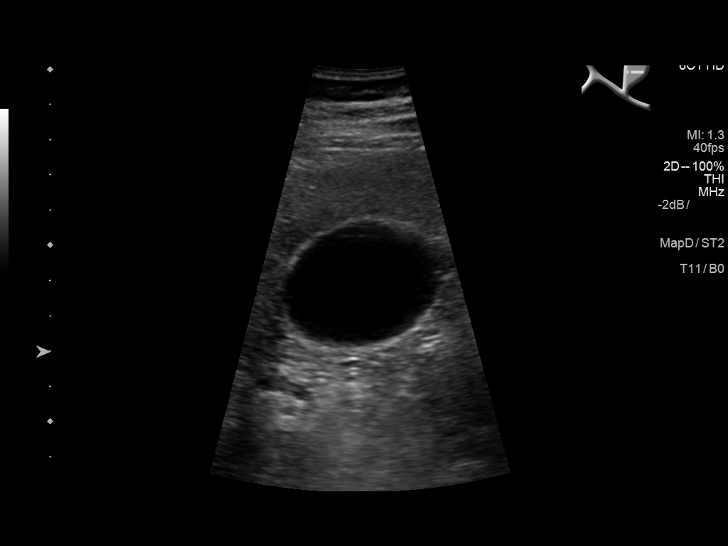
[im 23/134]
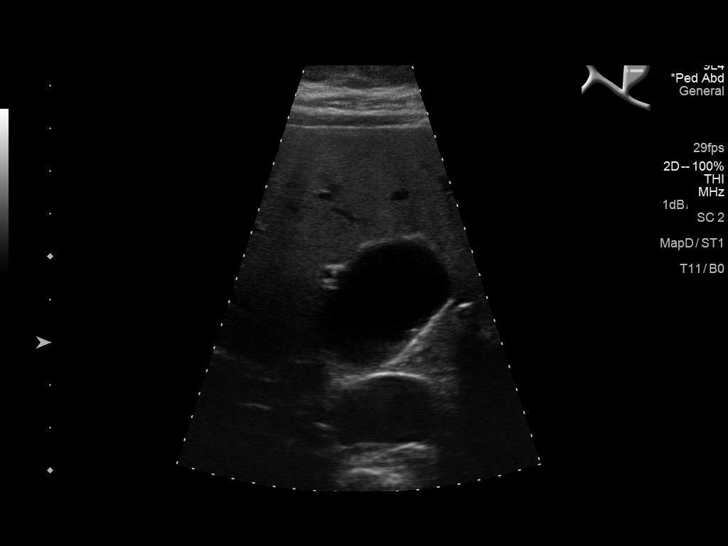
[im 34/134]
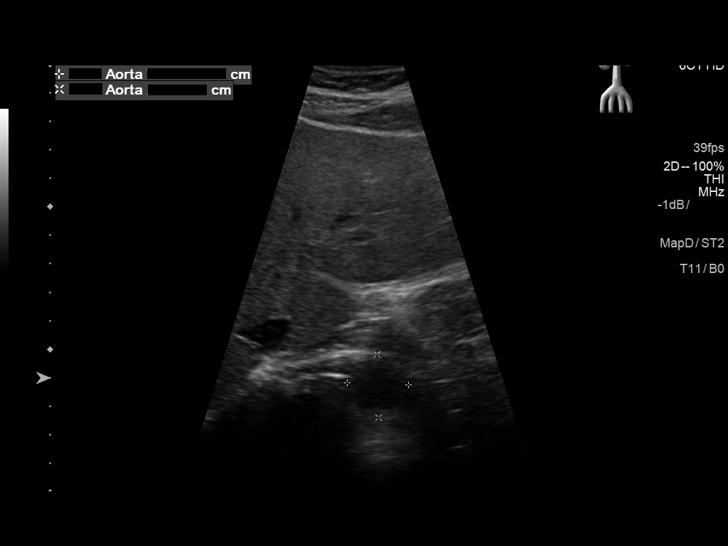
[im 45/134]
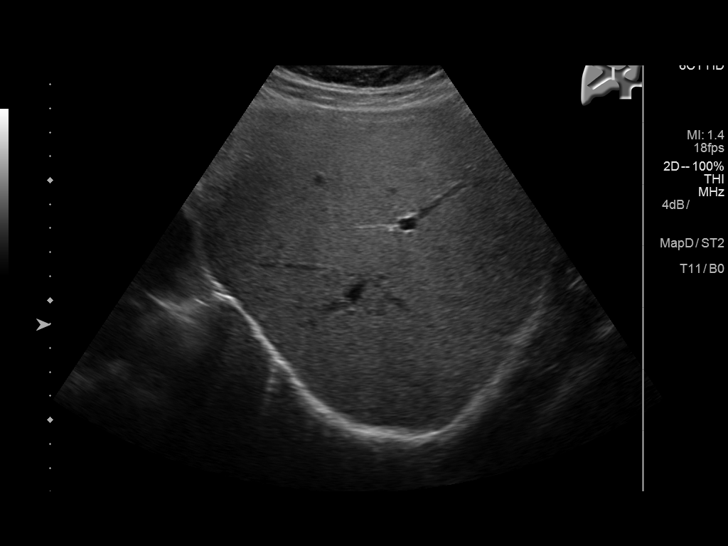
[im 56/134]
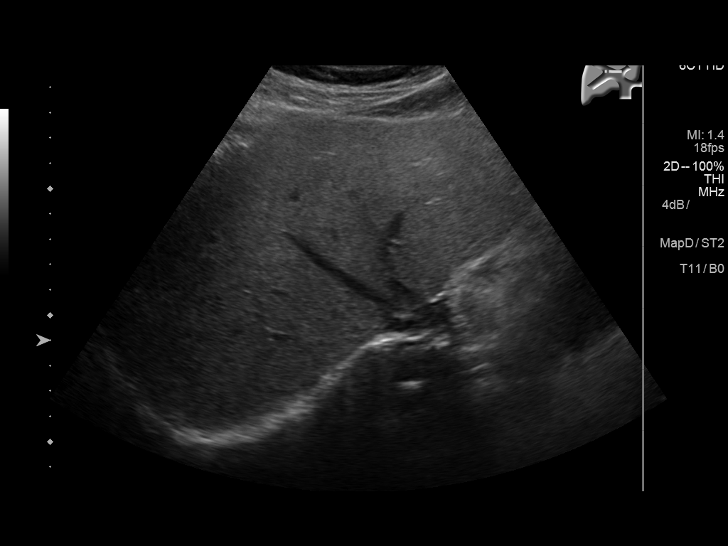
[im 67/134]
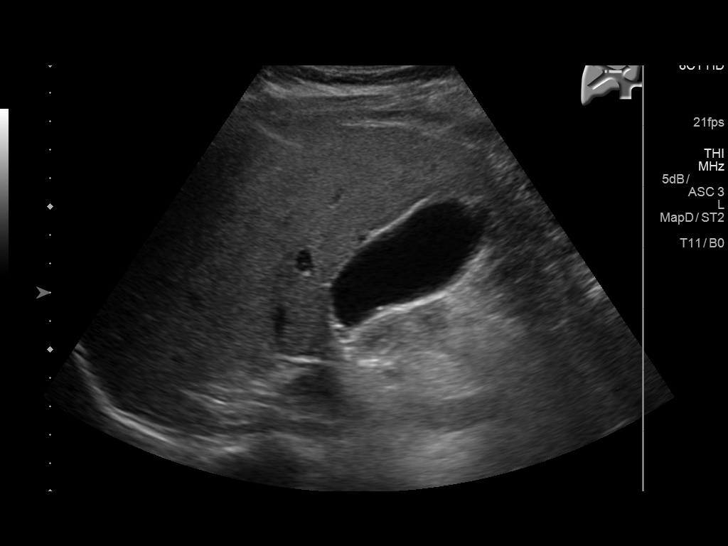
[im 78/134]
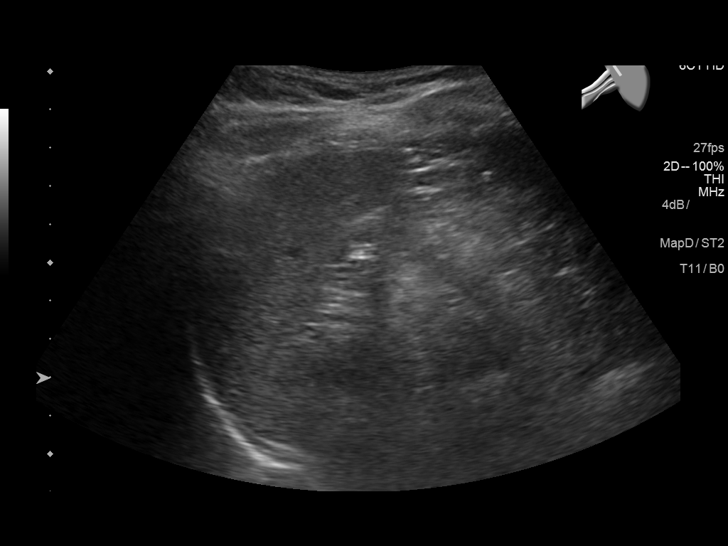
[im 89/134]
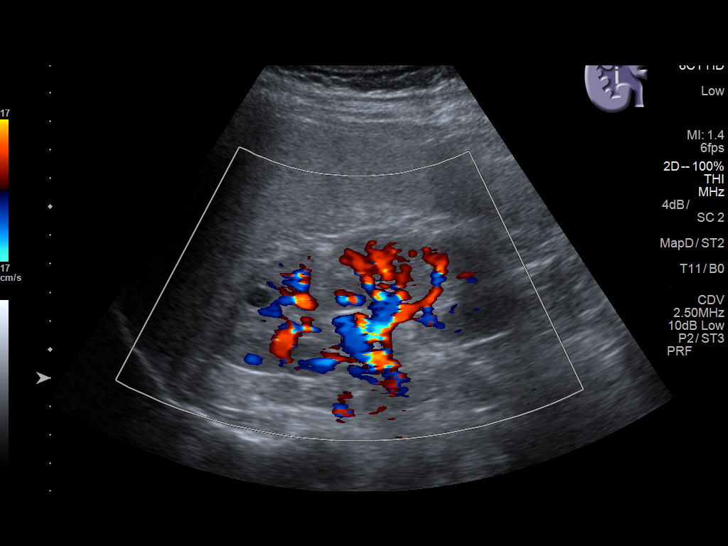
[im 100/134]
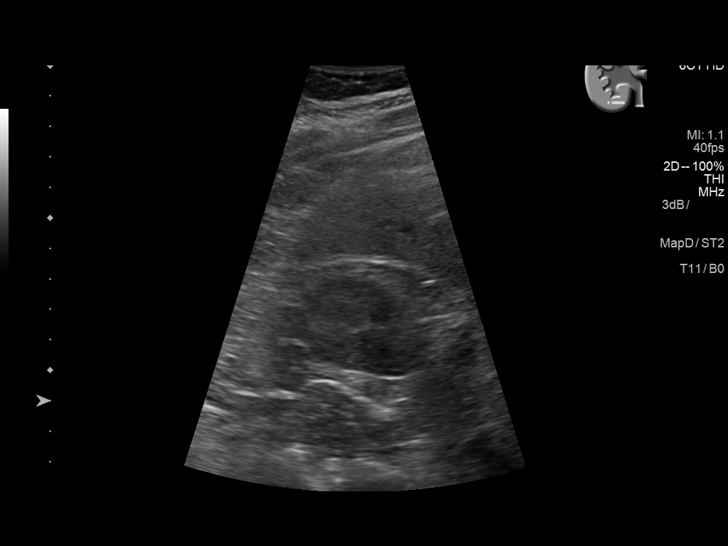
[im 111/134]
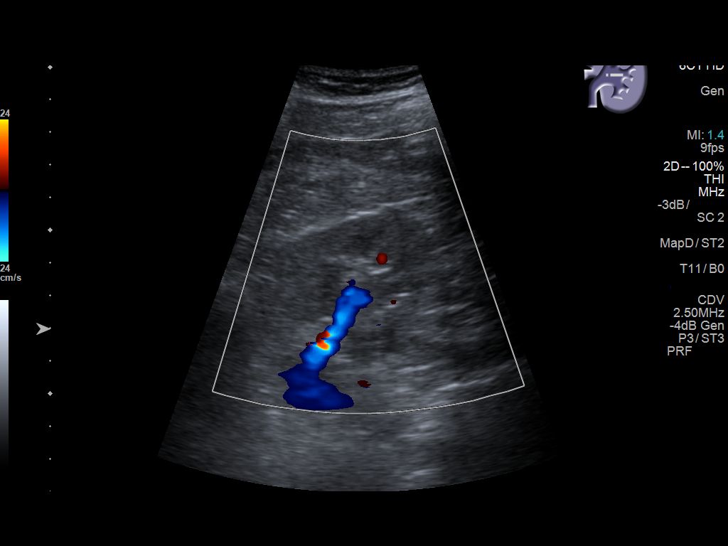
[im 122/134]
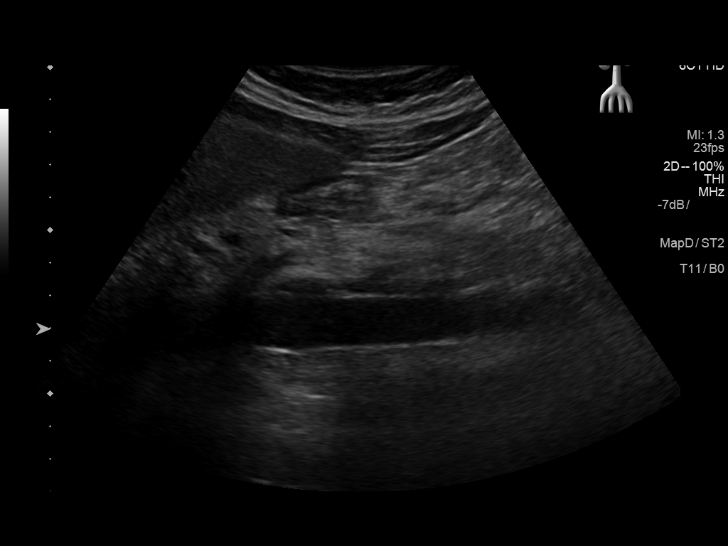
[im 134/134]
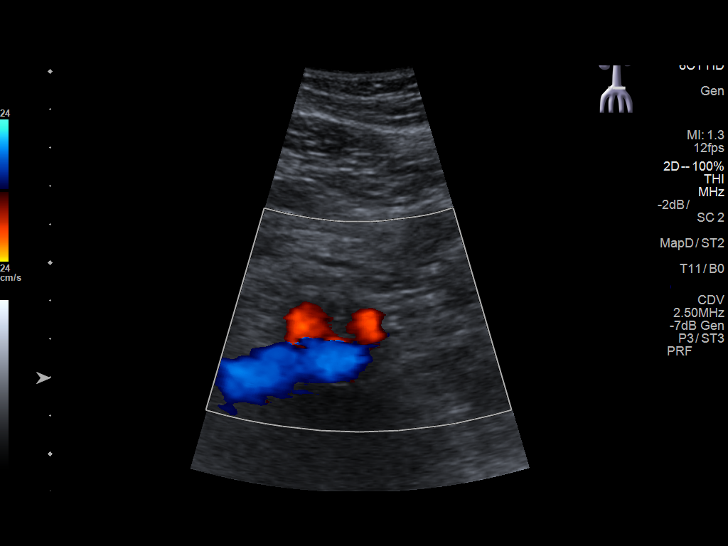

[13 of 25 positions shown; findings below may reference images not displayed]

FINDINGS: Gallbladder: No shadowing echogenic stones or dependent sludge.

4-5 millimeter echogenic gallbladder polyp along the non dependent
body wall demonstrated (images 14-16), inconsequential.

Otherwise normal wall thickness of 2-3 millimeters. No
pericholecystic fluid. No sonographic Murphy sign elicited.

Common bile duct: Diameter: 4 millimeters, normal

Liver: Echogenicity at the upper limits of normal to mildly
increased (image 58). Hepatic steatosis demonstrated in 8611, and
probably improved since that time. No discrete liver lesion. No
intrahepatic biliary ductal dilatation. Portal vein is patent on
color Doppler imaging with normal direction of blood flow towards
the liver.

IVC: No abnormality visualized.

Pancreas: Visualized portion unremarkable.

Spleen: Size and appearance within normal limits.

Right Kidney: Length: 11.3 centimeters small upper pole cortical
cyst suspected (image 98), and appears simple and benign.
Echogenicity within normal limits. No solid mass or hydronephrosis
visualized.

Left Kidney: Length: 10.8 centimeters. A small chronic lower pole
simple cyst is redemonstrated. There is a questionable lower pole
calculus measuring 5-7 millimeters (image 125).. Chronic renal
cortical lobulation seen to reflect multifocal left renal scarring
is redemonstrated. No left hydronephrosis or solid mass.

Abdominal aorta: No aneurysm visualized.

Other findings: None.
IMPRESSION: 1. Chronic fatty liver disease, probably improved since 8611. No
other liver or biliary abnormality identified by ultrasound.
2. Chronic left renal scarring. Questionable left renal lower pole
nephrolithiasis. Incidental small bilateral renal cysts.

## 2019-12-18 LAB — HEMOGLOBIN A1C: Hemoglobin A1C, External: 6 % (ref 4.8–6.0)

## 2021-02-06 ENCOUNTER — Encounter

## 2021-03-03 ENCOUNTER — Inpatient Hospital Stay: Admit: 2021-03-03 | Payer: PRIVATE HEALTH INSURANCE | Attending: Family | Primary: Family Medicine

## 2021-03-03 DIAGNOSIS — I10 Essential (primary) hypertension: Secondary | ICD-10-CM

## 2021-03-03 LAB — TRANSTHORACIC ECHOCARDIOGRAM (TTE) COMPLETE (CONTRAST/BUBBLE/3D PRN)
AV Area by Peak Velocity: 2.7 cm2
AV Area by VTI: 2.7 cm2
AV Mean Gradient: 2 mmHg
AV Mean Velocity: 0.7 m/s
AV Peak Gradient: 4 mmHg
AV Peak Velocity: 0.9 m/s
AV VTI: 15.1 cm
AV Velocity Ratio: 0.89
AVA/BSA Peak Velocity: 1.5 cm2/m2
AVA/BSA VTI: 1.5 cm2/m2
Ao Root Index: 1.56 cm/m2
Aortic Root: 2.9 cm
E/E' Lateral: 6.29
E/E' Ratio (Averaged): 7.54
E/E' Septal: 8.8
EF BP: 57 % (ref 55–100)
Fractional Shortening 2D: 31 % (ref 28–44)
IVSd: 1 cm — AB (ref 0.6–0.9)
LA Diameter: 2.9 cm
LA Size Index: 1.56 cm/m2
LA Volume 2C: 26 mL (ref 22–52)
LA Volume 4C: 31 mL (ref 22–52)
LA Volume A/L: 30 mL
LA Volume BP: 29 mL (ref 22–52)
LA Volume Index 2C: 14 mL/m2 — AB (ref 16–34)
LA Volume Index 4C: 17 mL/m2 (ref 16–34)
LA Volume Index A/L: 16 mL/m2 (ref 16–34)
LA Volume Index BP: 16 ml/m2 (ref 16–34)
LA/AO Root Ratio: 1
LV E' Lateral Velocity: 7 cm/s
LV E' Septal Velocity: 5 cm/s
LV EDV A2C: 49 mL
LV EDV A4C: 40 mL
LV EDV BP: 44 mL — AB (ref 56–104)
LV EDV Index A2C: 26 mL/m2
LV EDV Index A4C: 22 mL/m2
LV EDV Index BP: 24 mL/m2
LV ESV A2C: 22 mL
LV ESV A4C: 16 mL
LV ESV BP: 19 mL (ref 19–49)
LV ESV Index A2C: 12 mL/m2
LV ESV Index A4C: 9 mL/m2
LV ESV Index BP: 10 mL/m2
LV Ejection Fraction A2C: 57 %
LV Ejection Fraction A4C: 61 %
LV Mass 2D Index: 65.7 g/m2 (ref 43–95)
LV Mass 2D: 122.1 g (ref 67–162)
LV RWT Ratio: 0.51
LVIDd Index: 2.1 cm/m2
LVIDd: 3.9 cm (ref 3.9–5.3)
LVIDs Index: 1.45 cm/m2
LVIDs: 2.7 cm
LVOT Area: 3.5 cm2
LVOT Diameter: 2.1 cm
LVOT Mean Gradient: 1 mmHg
LVOT Peak Gradient: 2 mmHg
LVOT Peak Velocity: 0.8 m/s
LVOT SV: 41.9 ml
LVOT Stroke Volume Index: 22.5 mL/m2
LVOT VTI: 12.1 cm
LVOT:AV VTI Index: 0.8
LVPWd: 1 cm — AB (ref 0.6–0.9)
Left Ventricular Ejection Fraction: 58
MV A Velocity: 0.75 m/s
MV E Velocity: 0.44 m/s
MV E Wave Deceleration Time: 143.1 ms
MV E/A: 0.59
PV AT: 97.1 ms
PV Max Velocity: 0.8 m/s
PV Peak Gradient: 2 mmHg
RV Free Wall Peak S': 9 cm/s
RVIDd: 3.2 cm
TAPSE: 1.8 cm

## 2021-03-03 LAB — ECHO ADULT COMPLETE
AV Area by Peak Velocity: 2.7 cm2
AV Area by VTI: 2.7 cm2
AV Mean Gradient: 2 mmHg
AV Mean Velocity: 0.7 m/s
AV Peak Gradient: 4 mmHg
AV Peak Velocity: 0.9 m/s
AV VTI: 15.1 cm
AV Velocity Ratio: 0.89
AVA/BSA Peak Velocity: 1.5 cm2/m2
AVA/BSA VTI: 1.5 cm2/m2
Ao Root Index: 1.56 cm/m2
Aortic Root: 2.9 cm
E/E' Lateral: 6.29
E/E' Ratio (Averaged): 7.54
E/E' Septal: 8.8
EF BP: 57 % (ref 55–100)
Fractional Shortening 2D: 31 % (ref 28–44)
IVSd: 1 cm — AB (ref 0.6–0.9)
LA Diameter: 2.9 cm
LA Size Index: 1.56 cm/m2
LA Volume 2C: 26 mL (ref 22–52)
LA Volume 4C: 31 mL (ref 22–52)
LA Volume A/L: 30 mL
LA Volume BP: 29 mL (ref 22–52)
LA Volume Index 2C: 14 mL/m2 — AB (ref 16–34)
LA Volume Index 4C: 17 mL/m2 (ref 16–34)
LA Volume Index A/L: 16 mL/m2 (ref 16–34)
LA Volume Index BP: 16 ml/m2 (ref 16–34)
LA/AO Root Ratio: 1
LV E' Lateral Velocity: 7 cm/s
LV E' Septal Velocity: 5 cm/s
LV EDV A2C: 49 mL
LV EDV A4C: 40 mL
LV EDV BP: 44 mL — AB (ref 56–104)
LV EDV Index A2C: 26 mL/m2
LV EDV Index A4C: 22 mL/m2
LV EDV Index BP: 24 mL/m2
LV ESV A2C: 22 mL
LV ESV A4C: 16 mL
LV ESV BP: 19 mL (ref 19–49)
LV ESV Index A2C: 12 mL/m2
LV ESV Index A4C: 9 mL/m2
LV ESV Index BP: 10 mL/m2
LV Ejection Fraction A2C: 57 %
LV Ejection Fraction A4C: 61 %
LV Mass 2D Index: 65.7 g/m2 (ref 43–95)
LV Mass 2D: 122.1 g (ref 67–162)
LV RWT Ratio: 0.51
LVIDd Index: 2.1 cm/m2
LVIDd: 3.9 cm (ref 3.9–5.3)
LVIDs Index: 1.45 cm/m2
LVIDs: 2.7 cm
LVOT Area: 3.5 cm2
LVOT Diameter: 2.1 cm
LVOT Mean Gradient: 1 mmHg
LVOT Peak Gradient: 2 mmHg
LVOT Peak Velocity: 0.8 m/s
LVOT SV: 41.9 ml
LVOT Stroke Volume Index: 22.5 mL/m2
LVOT VTI: 12.1 cm
LVOT:AV VTI Index: 0.8
LVPWd: 1 cm — AB (ref 0.6–0.9)
MV A Velocity: 0.75 m/s
MV E Velocity: 0.44 m/s
MV E Wave Deceleration Time: 143.1 ms
MV E/A: 0.59
PV AT: 97.1 ms
PV Max Velocity: 0.8 m/s
PV Peak Gradient: 2 mmHg
RV Free Wall Peak S': 9 cm/s
RVIDd: 3.2 cm
TAPSE: 1.8 cm (ref 1.7–?)

## 2021-03-12 ENCOUNTER — Encounter

## 2021-03-25 ENCOUNTER — Inpatient Hospital Stay: Payer: PRIVATE HEALTH INSURANCE | Attending: Family | Primary: Family Medicine

## 2021-04-01 ENCOUNTER — Inpatient Hospital Stay: Payer: PRIVATE HEALTH INSURANCE | Attending: Family | Primary: Family Medicine

## 2021-04-24 ENCOUNTER — Inpatient Hospital Stay: Payer: PRIVATE HEALTH INSURANCE | Attending: Family | Primary: Family Medicine

## 2021-05-06 ENCOUNTER — Ambulatory Visit: Payer: PRIVATE HEALTH INSURANCE | Attending: Adult Health | Primary: Family Medicine

## 2021-05-08 ENCOUNTER — Ambulatory Visit: Payer: PRIVATE HEALTH INSURANCE | Primary: Family Medicine

## 2021-05-15 ENCOUNTER — Ambulatory Visit: Payer: PRIVATE HEALTH INSURANCE | Primary: Family Medicine

## 2021-05-15 ENCOUNTER — Encounter

## 2021-06-08 ENCOUNTER — Inpatient Hospital Stay: Payer: PRIVATE HEALTH INSURANCE | Primary: Family Medicine

## 2021-09-03 ENCOUNTER — Inpatient Hospital Stay: Admit: 2021-09-03 | Payer: PRIVATE HEALTH INSURANCE | Primary: Family Medicine

## 2021-09-03 DIAGNOSIS — R109 Unspecified abdominal pain: Secondary | ICD-10-CM

## 2021-09-03 LAB — POCT CREATININE AND GFR
POC Creatinine: 1 MG/DL (ref 0.6–1.3)
eGFR, POC: >60 mL/min/{1.73_m2} (ref >60)

## 2021-09-03 MED ORDER — IOPAMIDOL 61 % IV SOLN
61 % | Freq: Once | INTRAVENOUS | Status: AC | PRN
Start: 2021-09-03 — End: 2021-09-03
  Administered 2021-09-03: 14:00:00 100 mL via INTRAVENOUS

## 2021-09-03 MED FILL — ISOVUE-300 61 % IV SOLN: 61 % | INTRAVENOUS | Qty: 100

## 2021-09-09 ENCOUNTER — Encounter: Payer: PRIVATE HEALTH INSURANCE | Attending: Adult Health | Primary: Family Medicine

## 2021-09-09 ENCOUNTER — Encounter: Attending: Adult Health | Primary: Family Medicine

## 2021-10-01 NOTE — Telephone Encounter (Signed)
Formatting of this note might be different from the original.  The patient said she is unable to keep her appt for today with PP because she broke her toe last night and her daughter is taking her to urgent care this morning.  Electronically signed by Burman Blacksmith at 10/01/2021  9:13 AM EDT

## 2021-11-04 ENCOUNTER — Ambulatory Visit: Payer: PRIVATE HEALTH INSURANCE | Attending: Adult Health | Primary: Family Medicine

## 2021-12-22 ENCOUNTER — Inpatient Hospital Stay: Payer: PRIVATE HEALTH INSURANCE | Primary: Family Medicine

## 2021-12-22 NOTE — Other (Signed)
PAT Activity Status Questionnaire       Yes No Points for YES    Are you able to climb a flight of stairs or walk up a hill? [x]  []  1   Are you able to do heavy work around the house like lifting or moving heavy furniture? [x]  []  1   Do yardwork like raking leaves, weeding or pushing a power mower? [x]  []  1   Participate in strenuous sports like swimming, singles tennis, football, basketball or skiing? []  [x]  1   Total Score: 3       If TOTAL SCORE is <3, send chart for anesthesia review. Do not put any lotion, jewelry, makeup, fingernail or toenail polish; no wigs, no private piercings; no tictac,gum and mouthwash.  Please be aware that due to unforeseen circumstances, delays may occur and your patience will be appreciated. If you ae scheduled to be discharged the same day, please plan to be with for most of the day. If an inpatient, room assignments may be delayed as well.Our priority is to make you as comfortable as possible and to keep your family informed of your status when possible. Leave all valuables at home or loved ones;to include wallets/purse, money/credit cards, electronics  such as laptops and tablets. If you want to have your prescriptions filled here, please have some form of payment with your driver. Please arrange for your transportation home  Patient states family physician is aware of upcoming procedure/surgery.  Patient states that the family physician is not aware of upcoming procedure.  Hold supplements 2 weeks prior to [place].  No dnr

## 2021-12-23 NOTE — Other (Signed)
Pt needs a BMP. Per pt, she had labs drawn at [facility] recently. Faxed request for bloodwork to Tammy at Dr. Windell [place] office.

## 2021-12-29 ENCOUNTER — Inpatient Hospital Stay
Admit: 2021-12-29 | Discharge: 2021-12-31 | Disposition: A | Discharge status: Home / Self Care | Payer: PRIVATE HEALTH INSURANCE | Attending: Obstetrics & Gynecology | Admitting: Obstetrics & Gynecology

## 2021-12-29 DIAGNOSIS — N736 Female pelvic peritoneal adhesions (postinfective): Secondary | ICD-10-CM

## 2021-12-29 DIAGNOSIS — R1900 Intra-abdominal and pelvic swelling, mass and lump, unspecified site: Secondary | ICD-10-CM

## 2021-12-29 MED ORDER — ROCURONIUM BROMIDE 50 MG/5ML IV SOLN
50 MG/5ML | INTRAVENOUS | Status: DC | PRN
Start: 2021-12-29 — End: 2021-12-29
  Administered 2021-12-29: 12:00:00 50 via INTRAVENOUS
  Administered 2021-12-29: 13:00:00 20 via INTRAVENOUS

## 2021-12-29 MED ORDER — ACETAMINOPHEN 500 MG PO TABS
500 MG | Freq: Once | ORAL | Status: DC
Start: 2021-12-29 — End: 2021-12-29

## 2021-12-29 MED ORDER — NORMAL SALINE FLUSH 0.9 % IV SOLN
0.9 % | Freq: Two times a day (BID) | INTRAVENOUS | Status: DC
Start: 2021-12-29 — End: 2021-12-29

## 2021-12-29 MED ORDER — ONDANSETRON 4 MG PO TBDP
4 MG | Freq: Three times a day (TID) | ORAL | Status: DC | PRN
Start: 2021-12-29 — End: 2021-12-30

## 2021-12-29 MED ORDER — SODIUM CHLORIDE 0.9 % IV SOLN
0.9 % | INTRAVENOUS | Status: DC | PRN
Start: 2021-12-29 — End: 2021-12-29

## 2021-12-29 MED ORDER — FENTANYL CITRATE (PF) 100 MCG/2ML IJ SOLN
100 MCG/2ML | INTRAMUSCULAR | Status: DC | PRN
Start: 2021-12-29 — End: 2021-12-29
  Administered 2021-12-29: 11:00:00 100 via INTRAVENOUS

## 2021-12-29 MED ORDER — CEFAZOLIN SODIUM 2 G SOLR (MIXTURES ONLY)
2 g | Freq: Three times a day (TID) | Status: AC
Start: 2021-12-29 — End: 2021-12-30
  Administered 2021-12-29 – 2021-12-30 (×2): 2000 mg via INTRAVENOUS

## 2021-12-29 MED ORDER — ONDANSETRON HCL 4 MG/2ML IJ SOLN
4 MG/2ML | INTRAMUSCULAR | Status: DC | PRN
Start: 2021-12-29 — End: 2021-12-29
  Administered 2021-12-29: 13:00:00 4 via INTRAVENOUS

## 2021-12-29 MED ORDER — BUPIVACAINE HCL (PF) 0.25 % IJ SOLN
0.25 % | INTRAMUSCULAR | Status: DC | PRN
Start: 2021-12-29 — End: 2021-12-29
  Administered 2021-12-29: 14:00:00 30 via INTRADERMAL

## 2021-12-29 MED ORDER — MIDAZOLAM HCL 2 MG/2ML IJ SOLN
2 MG/ML | INTRAMUSCULAR | Status: DC | PRN
Start: 2021-12-29 — End: 2021-12-29
  Administered 2021-12-29: 11:00:00 2 via INTRAVENOUS

## 2021-12-29 MED ORDER — ONDANSETRON HCL 4 MG/2ML IJ SOLN
4 MG/2ML | INTRAMUSCULAR | Status: AC
Start: 2021-12-29 — End: ?

## 2021-12-29 MED ORDER — IBUPROFEN 600 MG PO TABS
600 MG | Freq: Three times a day (TID) | ORAL | Status: AC
Start: 2021-12-29 — End: ?
  Administered 2021-12-30 – 2021-12-31 (×5): 600 mg via ORAL

## 2021-12-29 MED ORDER — FENTANYL CITRATE (PF) 100 MCG/2ML IJ SOLN
100 MCG/2ML | INTRAMUSCULAR | Status: DC | PRN
Start: 2021-12-29 — End: 2021-12-29
  Administered 2021-12-29: 15:00:00 50 ug via INTRAVENOUS

## 2021-12-29 MED ORDER — ACETAMINOPHEN 500 MG PO TABS
500 MG | Freq: Once | ORAL | Status: AC
Start: 2021-12-29 — End: 2021-12-29
  Administered 2021-12-29: 11:00:00 1000 mg via ORAL

## 2021-12-29 MED ORDER — NORMAL SALINE FLUSH 0.9 % IV SOLN
0.9 % | Freq: Two times a day (BID) | INTRAVENOUS | Status: AC
Start: 2021-12-29 — End: ?
  Administered 2021-12-30 – 2021-12-31 (×3): 10 mL via INTRAVENOUS

## 2021-12-29 MED ORDER — BUPIVACAINE HCL (PF) 0.25 % IJ SOLN
0.25 % | INTRAMUSCULAR | Status: AC
Start: 2021-12-29 — End: ?

## 2021-12-29 MED ORDER — HYDROMORPHONE HCL 1 MG/ML IJ SOLN
1 MG/ML | INTRAMUSCULAR | Status: DC | PRN
Start: 2021-12-29 — End: 2021-12-29
  Administered 2021-12-29 (×2): .5 via INTRAVENOUS

## 2021-12-29 MED ORDER — OXYCODONE HCL 5 MG PO TABS
5 | ORAL | Status: DC | PRN
Start: 2021-12-29 — End: 2021-12-31
  Administered 2021-12-29 – 2021-12-31 (×4): 5 mg via ORAL

## 2021-12-29 MED ORDER — ALUM & MAG HYDROXIDE-SIMETH 200-200-20 MG/5ML PO SUSP
200-200-20 MG/5ML | Freq: Four times a day (QID) | ORAL | Status: AC | PRN
Start: 2021-12-29 — End: ?
  Administered 2021-12-29 – 2021-12-31 (×3): 30 mL via ORAL

## 2021-12-29 MED ORDER — CEFAZOLIN SODIUM 1 G IJ SOLR
1 g | INTRAMUSCULAR | Status: AC
Start: 2021-12-29 — End: ?

## 2021-12-29 MED ORDER — LACTATED RINGERS IV SOLN
INTRAVENOUS | Status: AC
Start: 2021-12-29 — End: ?
  Administered 2021-12-29 – 2021-12-30 (×3): via INTRAVENOUS

## 2021-12-29 MED ORDER — SUGAMMADEX SODIUM 200 MG/2ML IV SOLN
200 MG/2ML | INTRAVENOUS | Status: AC
Start: 2021-12-29 — End: ?

## 2021-12-29 MED ORDER — DEXAMETHASONE SODIUM PHOSPHATE 4 MG/ML IJ SOLN
4 MG/ML | INTRAMUSCULAR | Status: DC | PRN
Start: 2021-12-29 — End: 2021-12-29
  Administered 2021-12-29: 12:00:00 4 via INTRAVENOUS

## 2021-12-29 MED ORDER — METHYLENE BLUE 1 % IV SOLN
1 % | INTRAVENOUS | Status: DC | PRN
Start: 2021-12-29 — End: 2021-12-29
  Administered 2021-12-29: 13:00:00 30 via INTRAVENOUS
  Administered 2021-12-29: 13:00:00 60 via INTRAVENOUS

## 2021-12-29 MED ORDER — CEFAZOLIN SODIUM 1 G IJ SOLR
1 g | INTRAMUSCULAR | Status: DC | PRN
Start: 2021-12-29 — End: 2021-12-29
  Administered 2021-12-29: 12:00:00 2 via INTRAVENOUS

## 2021-12-29 MED ORDER — HYDROMORPHONE HCL PF 1 MG/ML IJ SOLN
1 MG/ML | INTRAMUSCULAR | Status: AC | PRN
Start: 2021-12-29 — End: ?
  Administered 2021-12-29: 18:00:00 0.5 mg via INTRAVENOUS

## 2021-12-29 MED ORDER — PROCHLORPERAZINE EDISYLATE 10 MG/2ML IJ SOLN
10 MG/2ML | Freq: Once | INTRAMUSCULAR | Status: DC | PRN
Start: 2021-12-29 — End: 2021-12-29

## 2021-12-29 MED ORDER — PROPOFOL 200 MG/20ML IV EMUL
200 MG/20ML | INTRAVENOUS | Status: DC | PRN
Start: 2021-12-29 — End: 2021-12-29
  Administered 2021-12-29: 12:00:00 150 via INTRAVENOUS

## 2021-12-29 MED ORDER — LIDOCAINE HCL (CARDIAC) PF 100 MG/5ML IV SOLN
100 MG/5ML | INTRAVENOUS | Status: DC | PRN
Start: 2021-12-29 — End: 2021-12-29
  Administered 2021-12-29: 11:00:00 100 via INTRAVENOUS

## 2021-12-29 MED ORDER — ONDANSETRON HCL 4 MG/2ML IJ SOLN
4 MG/2ML | Freq: Once | INTRAMUSCULAR | Status: AC | PRN
Start: 2021-12-29 — End: 2021-12-29
  Administered 2021-12-29: 15:00:00 4 mg via INTRAVENOUS

## 2021-12-29 MED ORDER — LIDOCAINE HCL (CARDIAC) PF 100 MG/5ML IV SOLN
100 MG/5ML | INTRAVENOUS | Status: AC
Start: 2021-12-29 — End: ?

## 2021-12-29 MED ORDER — SODIUM CHLORIDE 0.9 % IV SOLN
0.9 % | INTRAVENOUS | Status: AC | PRN
Start: 2021-12-29 — End: ?

## 2021-12-29 MED ORDER — ROCURONIUM BROMIDE 50 MG/5ML IV SOLN
50 MG/5ML | INTRAVENOUS | Status: AC
Start: 2021-12-29 — End: ?

## 2021-12-29 MED ORDER — HYDROMORPHONE HCL PF 1 MG/ML IJ SOLN
1 MG/ML | INTRAMUSCULAR | Status: DC | PRN
Start: 2021-12-29 — End: 2021-12-29

## 2021-12-29 MED ORDER — DOCUSATE SODIUM 100 MG PO CAPS
100 MG | Freq: Two times a day (BID) | ORAL | Status: AC
Start: 2021-12-29 — End: ?
  Administered 2021-12-29 – 2021-12-31 (×5): 100 mg via ORAL

## 2021-12-29 MED ORDER — DEXAMETHASONE SODIUM PHOSPHATE 4 MG/ML IJ SOLN
4 MG/ML | INTRAMUSCULAR | Status: AC
Start: 2021-12-29 — End: ?

## 2021-12-29 MED ORDER — ONDANSETRON HCL 4 MG/2ML IJ SOLN
4 MG/2ML | Freq: Four times a day (QID) | INTRAMUSCULAR | Status: DC | PRN
Start: 2021-12-29 — End: 2021-12-30
  Administered 2021-12-29 – 2021-12-30 (×3): 4 mg via INTRAVENOUS

## 2021-12-29 MED ORDER — SUGAMMADEX SODIUM 200 MG/2ML IV SOLN
200 MG/2ML | INTRAVENOUS | Status: DC | PRN
Start: 2021-12-29 — End: 2021-12-29
  Administered 2021-12-29: 14:00:00 150 via INTRAVENOUS

## 2021-12-29 MED ORDER — PROPOFOL 200 MG/20ML IV EMUL
200 MG/20ML | INTRAVENOUS | Status: AC
Start: 2021-12-29 — End: ?

## 2021-12-29 MED ORDER — NORMAL SALINE FLUSH 0.9 % IV SOLN
0.9 % | INTRAVENOUS | Status: AC | PRN
Start: 2021-12-29 — End: ?

## 2021-12-29 MED ORDER — OXYCODONE HCL 5 MG PO TABS
5 MG | ORAL | Status: AC | PRN
Start: 2021-12-29 — End: ?
  Administered 2021-12-30: 05:00:00 10 mg via ORAL

## 2021-12-29 MED ORDER — LACTATED RINGERS IV SOLN
INTRAVENOUS | Status: AC
Start: 2021-12-29 — End: ?
  Administered 2021-12-29 (×2): via INTRAVENOUS

## 2021-12-29 MED ORDER — MIDAZOLAM HCL 2 MG/2ML IJ SOLN
2 MG/ML | INTRAMUSCULAR | Status: AC
Start: 2021-12-29 — End: ?

## 2021-12-29 MED ORDER — FENTANYL CITRATE (PF) 100 MCG/2ML IJ SOLN
100 MCG/2ML | INTRAMUSCULAR | Status: AC
Start: 2021-12-29 — End: ?

## 2021-12-29 MED ORDER — LABETALOL HCL 5 MG/ML IV SOLN
5 MG/ML | INTRAVENOUS | Status: DC | PRN
Start: 2021-12-29 — End: 2021-12-29

## 2021-12-29 MED ORDER — NORMAL SALINE FLUSH 0.9 % IV SOLN
0.9 % | INTRAVENOUS | Status: DC | PRN
Start: 2021-12-29 — End: 2021-12-29

## 2021-12-29 MED ORDER — HYDROMORPHONE HCL 1 MG/ML IJ SOLN
1 MG/ML | INTRAMUSCULAR | Status: AC
Start: 2021-12-29 — End: ?

## 2021-12-29 MED FILL — ONDANSETRON HCL 4 MG/2ML IJ SOLN: 4 MG/2ML | INTRAMUSCULAR | Qty: 2

## 2021-12-29 MED FILL — LIDOCAINE HCL (CARDIAC) PF 100 MG/5ML IV SOLN: 100 MG/5ML | INTRAVENOUS | Qty: 5

## 2021-12-29 MED FILL — MAG-AL PLUS 200-200-20 MG/5ML PO LIQD: 200-200-20 MG/5ML | ORAL | Qty: 30

## 2021-12-29 MED FILL — HYDROMORPHONE HCL 1 MG/ML IJ SOLN: 1 MG/ML | INTRAMUSCULAR | Qty: 1

## 2021-12-29 MED FILL — MIDAZOLAM HCL 2 MG/2ML IJ SOLN: 2 MG/ML | INTRAMUSCULAR | Qty: 2

## 2021-12-29 MED FILL — FENTANYL CITRATE (PF) 100 MCG/2ML IJ SOLN: 100 MCG/2ML | INTRAMUSCULAR | Qty: 2

## 2021-12-29 MED FILL — BUPIVACAINE HCL (PF) 0.25 % IJ SOLN: 0.25 % | INTRAMUSCULAR | Qty: 30

## 2021-12-29 MED FILL — IBUPROFEN 600 MG PO TABS: 600 MG | ORAL | Qty: 1

## 2021-12-29 MED FILL — LACTATED RINGERS IV SOLN: INTRAVENOUS | Qty: 1000

## 2021-12-29 MED FILL — BD POSIFLUSH 0.9 % IV SOLN: 0.9 % | INTRAVENOUS | Qty: 40

## 2021-12-29 MED FILL — DEXAMETHASONE SODIUM PHOSPHATE 4 MG/ML IJ SOLN: 4 MG/ML | INTRAMUSCULAR | Qty: 1

## 2021-12-29 MED FILL — ACETAMINOPHEN 500 MG PO TABS: 500 MG | ORAL | Qty: 2

## 2021-12-29 MED FILL — BRIDION 200 MG/2ML IV SOLN: 200 MG/2ML | INTRAVENOUS | Qty: 2

## 2021-12-29 MED FILL — PROPOFOL 200 MG/20ML IV EMUL: 200 MG/20ML | INTRAVENOUS | Qty: 20

## 2021-12-29 MED FILL — ROCURONIUM BROMIDE 50 MG/5ML IV SOLN: 50 MG/5ML | INTRAVENOUS | Qty: 5

## 2021-12-29 MED FILL — OXYCODONE HCL 5 MG PO TABS: 5 MG | ORAL | Qty: 1

## 2021-12-29 MED FILL — STOOL SOFTENER 100 MG PO CAPS: 100 MG | ORAL | Qty: 1

## 2021-12-29 MED FILL — CEFAZOLIN SODIUM 1 G IJ SOLR: 1 g | INTRAMUSCULAR | Qty: 1000

## 2021-12-29 MED FILL — CEFAZOLIN SODIUM 2 G IV SOLR: 2 g | INTRAVENOUS | Qty: 2000

## 2021-12-29 NOTE — Progress Notes (Addendum)
1210  Bedside and verbal shift change report received from [place], [place]. Report included SBAR, MAR, and recent results.     1217  Received patient in satisfactory condition. VSS. Assumed care of patient. No pressure areas noted. Fall risk band in place. Patient is alert and oriented x 4. Patient is calm and cooperative. Patients PERRLA. No numbness or tingling to BLE. +2 pulses palpable and equal bilaterally. Capillary Refill less than 3 seconds. Lung sounds clear bilaterally. Respirations even and unlabored. No use of accessory muscles. Educated on incentive spirometer and demonstrated proper use. Abdomen is soft and non-tender. Bowel sounds hypoactive to all quadrants. Patient has not voided post-operatively. No bladder distention evident. No complaints of bladder discomfort. Skin is warm, dry and skin color is appropriate to race. Mepilex dressing to midline incision noted CDI. 3 trocar sites noted CDI. No other skin integrity issues present. Sequential compression device applied to BLE. IV intact to left wrist and infusing without difficulty. Reports nausea at this time. Zofran given in PACU. Patient oriented to call bell use as well as bed use. Patient oriented to phone and how to order meals. Call bell within reach. Bed in low position. Three side rails up.     1359  Pt drank a few small sips of water. Pt refuses any other clear liquids at this time.     1450  Patient ambulated to restroom. Voided 350 blue urine. Pt complaint of nausea. Cold compress given for forehead. Pt satisfied.     1730  PRN Zofran given for nausea. Cool compress applied to forehead.     1800  PRN maalox given for complaints of indigestion.     1820  Pt ambulated to restroom. Voided 200 ml blue urine.     1910  Bedside and verbal shift change report given to [place], RN by Bonnee Quin, RN. Report included SBAR, MAR, and recent results.

## 2021-12-29 NOTE — Op Note (Signed)
[facility]  OPERATIVE REPORT    Name:  Lisa Meza, Lisa Meza  MR#:   [id number]  DOB:  1960-12-05  ACCOUNT #:  [id number]  DATE OF SERVICE:  12/29/2021    PREOPERATIVE DIAGNOSIS:  Pelvic mass.    POSTOPERATIVE DIAGNOSES:  1.  Pelvic mass.  2.  Pelvic adhesions.    PROCEDURES PERFORMED:  Attempted laparoscopy, laparotomy with right salpingo-oophorectomy and lysis of adhesions, and left salpingectomy, cystoscopy.    SURGEON:  Valarie Merino, MD    ASSISTANT:  As per OR record.    ANESTHESIA:  General.    COMPLICATIONS:  No complications.    SPECIMENS REMOVED:  Right tube and ovary and left tube.    IMPLANTS:  No implants.    ESTIMATED BLOOD LOSS:  250 mL.    DRAINS:  No drains.    PROCEDURE:  The patient was taken to the operating room and was prepped and draped in dorsal lithotomy position when an adequate level of general anesthesia was obtained.  Foley catheter was placed.  A weighted speculum was placed in the vagina.  A curved Deaver was used to visualize the cervix which was grasped with a single-tooth tenaculum.  A Hulka manipulator was placed and attention was drawn to the anterior abdominal wall.  The mass could be palpated to be up to the umbilicus, therefore an incision was made above the umbilicus and a Veress needle was placed such that pneumoperitoneum could be created.  Once an adequate pneumoperitoneum was created, a 5 mm trocar with sleeve was placed under direct visualization through the laparoscope.  On entry, it was seen there was a large pelvic mass filling the entire pelvis up to the umbilicus.  A left lower quadrant incision was then able to be made and a 5 mm trocar with sleeve was able to be placed under direct visualization through the laparoscope.  Manipulation of the mass revealed that there was some adhesions on the right side.  A puncture site was made on the right mid quadrant and through this, a 5 mm trocar with sleeve was placed under direct visualization.  There was some  lysis of adhesions that was able to be accomplished of the bowel and the sidewall off of the pelvic mass which appeared to be smooth-walled but fixed in the pelvis, unable to be manipulated.  Pelvic washings were obtained and I was unable to manipulate the mass enough or get around it with the scope in order to do an adnexectomy by laparoscopy.  Therefore, decision was made to abort laparoscopy and proceed with laparotomy.  A radical incision was made in the abdomen down to the fascia which was incised both superiorly and inferiorly.  Peritoneum was entered.  The pelvic mass was able to be delivered through the incision and some pedicles were able to be tied with 0 Vicryl suture; however, the posterior part of this mass was adherent to the cul-de-sac and the sidewall.  However, it did come up out of the incision and it did come free of the cul-de-sac easily with some blunt dissection and once this was accomplished, the mass was able to be resected at its pedicle and sent to Pathology.  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  The pedicles were tied with 0 Vicryl suture.  At this point, it was noted that there was some bleeding off on this right side, on the right sidewall and on the right uterine artery and it had been adherent to this area  as well.  Multiple sutures of 0 Vicryl suture were used to stop this bleeding.  The ureter was felt to be far away from the operation; however, because of its proximity on the sidewall, the patient was given methylene blue intravenously.  There was no extravasation of the dye.  The left tube was grasped at its mesosalpinx and was resected and the mesosalpinx was tied with 0 Vicryl suture as well.  With pressure in the pelvis, the patient's legs were repositioned.  Foley catheter was removed.  Cystoscopy was performed.  The right ureter was noted to be patent and spilling blue dye vigorously and there was no injury to the bladder.  The Foley catheter was replaced.   Attention was then drawn to the intra-abdominal cavity again.  There was some bleeding of the left cornua of the uterus.  This was stopped with 0 Vicryl suture.  Copious irrigation was then utilized to ascertain hemostasis which was noted.  Some irrigation was left in the pelvis.  All instruments were removed from the abdominal cavity.  The fascia was closed with 0 Vicryl running suture.  Subcutaneous tissue was closed with 2-0 plain and the skin was closed with 4-0 Monocryl in a subcuticular fashion.  Infiltration with Marcaine in all incisions was accomplished.  All instruments were removed.  The patient was extubated in the operating room and transferred to the recovery room in stable condition.  All counts were correct at the end of the procedure.      Arizona Constable, MD      [id number]  D:  12/29/2021 10:53  T:  12/29/2021 20:34  JOB #:  [id number]

## 2021-12-29 NOTE — Progress Notes (Signed)
Patient transferred to unit 202 with nurse and personal belongings at bedside. Skin assessment completed with Felicia RN at bedside.     BP 107/63   Pulse 72   Temp 97.3 F (36.3 C) (Oral)   Resp 14   Ht 5\' 6"  (1.676 m)   Wt 176 lb 14.4 oz (80.2 kg)   SpO2 100%   BMI 28.55 kg/m

## 2021-12-29 NOTE — Other (Signed)
Room 462 nurse felicia SBAR ready

## 2021-12-29 NOTE — Progress Notes (Signed)
1125 - Patient arrives to unit at this time. Patient is A/O x 4. IV to left wrist intact and patent.  SCDS applied bilaterally.  3 Trocar sites and one midline incision dressing to midline incision with two pinpoint areas of old blood. Pain 5/10. C/O nausea, medicated prior to leaving PACU. Cool washcloth placed on forehead, ice pack applied to abdomen. Patient was oriented to the room to include use of call bell, meal ordering, and use of incentive spirometer.  Patient was given explanation of " up for dinner" program and has verbalized understanding. Bed in lowest position. Phone and call bell left within reach. Patient educated on need to call for assistance before getting OOB.  Plan of care for the day addressed with patient. Educated on pain medication availability and possible side effects.     1210 - Bedside verbal report given to Jenny Reichmann, RN at this time. Patient in bed resting with eyes closed, no needs at this time.

## 2021-12-29 NOTE — H&P (Signed)
Update History & Physical    The patient's History and Physical of December 29, 2021 was reviewed with the patient and I examined the patient. There was no change. The surgical site was confirmed by the patient and me.       Plan: The risks, benefits, expected outcome, and alternative to the recommended procedure have been discussed with the patient. Patient understands and wants to proceed with the procedure.     Electronically signed by Markus Jarvis, MD on 12/29/2021 at 7:18 AM

## 2021-12-29 NOTE — Other (Signed)
Family updated.

## 2021-12-29 NOTE — Other (Signed)
Attempted to get a room and a nurse, nurse not available.

## 2021-12-29 NOTE — Brief Op Note (Signed)
Brief Postoperative Note      Patient: Lisa Meza  Date of Birth: [birth year]  MRN: [id number]    Date of Procedure: 12/29/2021    Pre-Op Diagnosis: Pelvic mass [R19.00]    Post-Op Diagnosis: Same/pelvic adhesions       Procedure(s):  LAPAROTOMY WITH RIGHT SALPINGO OOPHORECTOMY LEFT SALPINGECTOMY, LYSIS OF ADHESIONS, CYSTOSCOPY    Surgeon(s):  Valarie Merino, MD    Assistant:  Surgical Assistant: Ileene Musa    Anesthesia: General    Estimated Blood Loss (mL): 169     Complications: None    Specimens:   ID Type Source Tests Collected by Time Destination   A : Pelvic Washing Body Fluid Pelvic Washings SURGICAL PATHOLOGY, CYTOLOGY, NON-GYN Valarie Merino, MD 12/29/2021 0755    B : RIGHT OVARY Tissue Ovary SURGICAL PATHOLOGY Valarie Merino, MD 12/29/2021 [phone]    C : left fallopian tube Tissue Fallopian Tube SURGICAL PATHOLOGY Valarie Merino, MD 12/29/2021 0905        Implants:  none      Drains:   Urinary Catheter 12/29/21 Foley (Active)       Findings: large 15 cm mass on right ovary adherent to sidewall and cds and bowel, unable to mobilize by laparoscopy therefore laparotomy with right so and left salpingectomy.. fs -benign serous cystadenoma    Electronically signed by Markus Jarvis, MD on 12/29/2021 at 9:59 AM

## 2021-12-29 NOTE — Progress Notes (Signed)
Placed a call to on-call for pt's request for melatonin. New orders received for 5mg  PRN nightly.

## 2021-12-29 NOTE — Progress Notes (Addendum)
Dr. Bronson Ing and Nurse talked to daughter Donnajean Lopes [phone]) at [phone] due to having to convert from laparoscopic to laparotomy. Daughter Donnajean Lopes verbally consented through telephone for  open laparotomy removal of right salpingo-ophorectomy and left salpingectomy.

## 2021-12-29 NOTE — Progress Notes (Signed)
TRANSFER - IN REPORT:    Verbal report received from [place], RN on [employer]. [place]  being received from PACU for routine post - op.      Report consisted of patient's Situation, Background, Assessment and   Recommendations(SBAR).     Information from the following report(s) SBAR, Kardex, OR Summary, Intake/Output and MAR was reviewed with the receiving nurse.    Opportunity for questions and clarification was provided.      Assessment to be completed upon patient's arrival to unit and care assumed.

## 2021-12-29 NOTE — Progress Notes (Signed)
TRANSFER - OUT REPORT:    Verbal report given to [facility] RN on Lisa Meza  being transferred to [place] for routine progression of patient care       Report consisted of patient's Situation, Background, Assessment and   Recommendations(SBAR).     Information from the following report(s) Nurse Handoff Report, Adult Overview, Surgery Report, Intake/Output, and MAR was reviewed with the receiving nurse.           Lines:   Peripheral IV 12/29/21 Left;Ventral Wrist (Active)   Site Assessment Clean, dry & intact 12/29/21 1110   Line Status Infusing 12/29/21 1110   Phlebitis Assessment No symptoms 12/29/21 1110   Infiltration Assessment 0 12/29/21 1110   Dressing Status Clean, dry & intact 12/29/21 1110   Dressing Type Transparent 12/29/21 1110        Opportunity for questions and clarification was provided.      Patient transported with:  [employer]2 @ 2lpm, Registered Nurse, and [employer]

## 2021-12-29 NOTE — Anesthesia Pre-Procedure Evaluation (Signed)
Department of Anesthesiology  Preprocedure Note       Name:  Lisa Meza   Age:  61 y.o.  DOB:  [birth year]                                          MRN:  [id number]         Date:  12/29/2021      Surgeon: Moishe Spice):  Arizona Constable, MD    Procedure: Procedure(s):  RIGHT SALPINGO OOPHORECTOMY LEF SALPINGECTOMY    Medications prior to admission:   Prior to Admission medications    Medication Sig Start Date End Date Taking? Authorizing Provider   valsartan-hydroCHLOROthiazide (DIOVAN-HCT) 160-12.5 MG per tablet Take 1 tablet by mouth daily 05/20/21   Historical Provider, MD   omeprazole (PRILOSEC) 20 MG delayed release capsule Take 1 capsule by mouth daily 01/12/21   Historical Provider, MD   vitamin D (ERGOCALCIFEROL) 1.25 MG (50000 UT) CAPS capsule once a week 06/17/21   Historical Provider, MD   alendronate (FOSAMAX) 70 MG tablet once a week 05/26/21   Historical Provider, MD   calcium carbonate 1500 (600 Ca) MG TABS tablet daily 06/03/21   Historical Provider, MD   Omega-3 Fatty Acids (FISH OIL) 1000 MG CPDR Take 3 capsules by mouth every other day    Historical Provider, MD       Current medications:    Current Facility-Administered Medications   Medication Dose Route Frequency Provider Last Rate Last Admin   . lactated ringers IV soln infusion   IntraVENous Continuous [place] Constable, MD 125 mL/hr at 12/29/21 0618 New Bag at 12/29/21 0618   . acetaminophen (TYLENOL) tablet 1,000 mg  1,000 mg Oral Once Renee Pain, MD           Allergies:    Allergies   Allergen Reactions   . Codeine Nausea Only and Nausea And Vomiting     Severe Nausea         Problem List:  There is no problem list on file for this patient.      Past Medical History:        Diagnosis Date   . Asthma     last episode 2018   . Depression    . Fatty liver    . GERD (gastroesophageal reflux disease)    . Head injury     childhood; loc time unknown hospitilized; sutures head   . Headache    . Hypertension     age 77s   . Sleep apnea     not  using  machine   . Wears dentures     full; do not use adhesive on dos   . Wears glasses     reading       Past Surgical History:        Procedure Laterality Date   . ABDOMEN SURGERY      cyst removal   . ECTOPIC PREGNANCY SURGERY     . OVARY REMOVAL         Social History:    Social History     Tobacco Use   . Smoking status: Former     Types: Cigarettes   . Smokeless tobacco: Never   Substance Use Topics   . Alcohol use: Never  Counseling given: Not Answered      Vital Signs (Current):   Vitals:    12/29/21 0544   BP: 133/81   Pulse: 75   Resp: 16   Temp: 98 F (36.7 C)   TempSrc: Temporal   SpO2: 100%   Weight: 176 lb 14.4 oz (80.2 kg)   Height: 5\' 6"  (1.676 m)                                              BP Readings from Last 3 Encounters:   12/29/21 133/81       NPO Status: Time of last liquid consumption: 0500                        Time of last solid consumption: 1830                        Date of last liquid consumption: 12/29/21                        Date of last solid food consumption: 12/28/21    BMI:   Wt Readings from Last 3 Encounters:   12/29/21 176 lb 14.4 oz (80.2 kg)   12/22/21 173 lb (78.5 kg)     Body mass index is 28.55 kg/m.    CBC: No results found for: "WBC", "RBC", "HGB", "HCT", "MCV", "RDW", "PLT"    CMP:   Lab Results   Component Value Date/Time    CREATININE 1.0 09/03/2021 09:57 AM       POC Tests: No results for input(s): "POCGLU", "POCNA", "POCK", "POCCL", "POCBUN", "POCHEMO", "POCHCT" in the last 72 hours.    Coags: No results found for: "PROTIME", "INR", "APTT"    HCG (If Applicable): No results found for: "PREGTESTUR", "PREGSERUM", "HCG", "HCGQUANT"     ABGs: No results found for: "PHART", "PO2ART", "PCO2ART", "HCO3ART", "BEART", "O2SATART"     Type & Screen (If Applicable):  No results found for: "LABABO", "LABRH"    Drug/Infectious Status (If Applicable):  No results found for: "HIV", "HEPCAB"    COVID-19 Screening (If Applicable): No results found  for: "COVID19"        Anesthesia Evaluation  Patient summary reviewed and Nursing notes reviewed  Airway: Mallampati: III  TM distance: >3 FB   Neck ROM: full  Mouth opening: > = 3 FB   Dental:    (+) edentulous      Pulmonary:normal exam  breath sounds clear to auscultation  (+) sleep apnea: on noncompliant,  asthma: allergic asthma,                            Cardiovascular:  Exercise tolerance: good (>4 METS),   (+) hypertension: mild and no interval change,       ECG reviewed  Rhythm: regular  Rate: normal           Beta Blocker:  Not on Beta Blocker         Neuro/Psych:   (+) headaches:, psychiatric history:            GI/Hepatic/Renal:   (+) GERD: well controlled, liver disease:,           Endo/Other: Negative Endo/Other ROS  Abdominal: normal exam            Vascular:          Other Findings:           Anesthesia Plan      general     ASA 2       Induction: intravenous.  continuous noninvasive hemodynamic monitor  MIPS: Prophylactic antiemetics administered.  Anesthetic plan and risks discussed with patient.    Use of blood products discussed with patient whom consented to blood products.   Plan discussed with CRNA.    Attending anesthesiologist reviewed and agrees with Preprocedure content                Buren Kos, MD   12/29/2021

## 2021-12-29 NOTE — Plan of Care (Signed)
Problem: Pain  Goal: Verbalizes/displays adequate comfort level or baseline comfort level  Outcome: Progressing     Problem: Discharge Planning  Goal: Discharge to home or other facility with appropriate resources  Outcome: Progressing     Problem: ABCDS Injury Assessment  Goal: Absence of physical injury  Outcome: Progressing

## 2021-12-30 LAB — CBC WITH AUTO DIFFERENTIAL
Absolute Immature Granulocyte: 0 10*3/uL (ref 0.00–0.04)
Basophils %: 0 % (ref 0–2)
Basophils Absolute: 0 10*3/uL (ref 0.0–0.1)
Eosinophils %: 0 % (ref 0–5)
Eosinophils Absolute: 0 10*3/uL (ref 0.0–0.4)
Hematocrit: 28.9 % — ABNORMAL LOW (ref 35.0–45.0)
Hemoglobin: 9.4 g/dL — ABNORMAL LOW (ref 12.0–16.0)
Immature Granulocytes: 0 % (ref 0.0–0.5)
Lymphocytes %: 16 % — ABNORMAL LOW (ref 21–52)
Lymphocytes Absolute: 1.5 10*3/uL (ref 0.9–3.6)
MCH: 29.7 PG (ref 24.0–34.0)
MCHC: 32.5 g/dL (ref 31.0–37.0)
MCV: 91.5 FL (ref 78.0–100.0)
MPV: 10.7 FL (ref 9.2–11.8)
Monocytes %: 10 % (ref 3–10)
Monocytes Absolute: 0.9 10*3/uL (ref 0.05–1.2)
Neutrophils %: 74 % — ABNORMAL HIGH (ref 40–73)
Neutrophils Absolute: 6.8 10*3/uL (ref 1.8–8.0)
Nucleated RBCs: 0 PER 100 WBC
Platelets: 171 10*3/uL (ref 135–420)
RBC: 3.16 M/uL — ABNORMAL LOW (ref 4.20–5.30)
RDW: 13.3 % (ref 11.6–14.5)
WBC: 9.2 10*3/uL (ref 4.6–13.2)
nRBC: 0 10*3/uL (ref 0.00–0.01)

## 2021-12-30 MED ORDER — ONDANSETRON HCL 4 MG/2ML IJ SOLN
4 MG/2ML | INTRAMUSCULAR | Status: DC | PRN
Start: 2021-12-30 — End: 2021-12-31
  Administered 2021-12-30: 17:00:00 4 mg via INTRAVENOUS

## 2021-12-30 MED ORDER — MELATONIN 5 MG PO TBDP
5 MG | Freq: Every evening | ORAL | Status: AC | PRN
Start: 2021-12-30 — End: ?
  Administered 2021-12-30 – 2021-12-31 (×2): 5 mg via ORAL

## 2021-12-30 MED ORDER — ONDANSETRON 4 MG PO TBDP
4 MG | Freq: Three times a day (TID) | ORAL | Status: AC | PRN
Start: 2021-12-30 — End: ?
  Administered 2021-12-31 (×2): 4 mg via ORAL

## 2021-12-30 MED ORDER — MELATONIN 5 MG PO TBDP
5 MG | Freq: Every evening | ORAL | Status: DC
Start: 2021-12-30 — End: 2021-12-29

## 2021-12-30 MED ORDER — PROMETHAZINE HCL 25 MG PO TABS
25 MG | Freq: Four times a day (QID) | ORAL | Status: AC | PRN
Start: 2021-12-30 — End: ?

## 2021-12-30 MED FILL — STOOL SOFTENER 100 MG PO CAPS: 100 MG | ORAL | Qty: 1

## 2021-12-30 MED FILL — IBUPROFEN 600 MG PO TABS: 600 MG | ORAL | Qty: 1

## 2021-12-30 MED FILL — ONDANSETRON HCL 4 MG/2ML IJ SOLN: 4 MG/2ML | INTRAMUSCULAR | Qty: 2

## 2021-12-30 MED FILL — OXYCODONE HCL 5 MG PO TABS: 5 MG | ORAL | Qty: 1

## 2021-12-30 MED FILL — MELATONIN 5 MG PO TBDP: 5 MG | ORAL | Qty: 1

## 2021-12-30 MED FILL — CEFAZOLIN SODIUM 2 G IV SOLR: 2 g | INTRAVENOUS | Qty: 2000

## 2021-12-30 MED FILL — OXYCODONE HCL 5 MG PO TABS: 5 MG | ORAL | Qty: 2

## 2021-12-30 NOTE — Progress Notes (Signed)
Gynecology Progress Note    Patient doing well post-op day 1 from Procedure(s):  LAPAROTOMY WITH RIGHT SALPINGO OOPHORECTOMY LEFT SALPINGECTOMY, LYSIS OF ADHESIONS, CYSTOSCOPY without significant complaints.  Pain controlled on current medication.  Voiding without difficulty. Patient is not  passing flatus.tolerating fluids voidsing well, up walking, slightly nauseated. Ambulate today all questions answered    Vitals:  Blood pressure 107/61, pulse 89, temperature 98.8 F (37.1 C), temperature source Oral, resp. rate 16, height 5\' 6"  (1.676 m), weight 176 lb 14.4 oz (80.2 kg), SpO2 98 %.  Temp (24hrs), Avg:98.2 F (36.8 C), Min:97.2 F (36.2 C), Max:99 F (37.2 C)        Exam:  Patient without distress.               Abdomen soft,  nontender.                 Incision dry and clean without erythema.               Lower extremities are negative for swelling, cords, or tenderness.    Labs:   Recent Results (from the past 24 hour(s))   CBC with Auto Differential    Collection Time: 12/30/21  1:23 AM   Result Value Ref Range    WBC 9.2 4.6 - 13.2 K/uL    RBC 3.16 (L) 4.20 - 5.30 M/uL    Hemoglobin 9.4 (L) 12.0 - 16.0 g/dL    Hematocrit 28.9 (L) 35.0 - 45.0 %    MCV 91.5 78.0 - 100.0 FL    MCH 29.7 24.0 - 34.0 PG    MCHC 32.5 31.0 - 37.0 g/dL    RDW 13.3 11.6 - 14.5 %    Platelets 171 135 - 420 K/uL    MPV 10.7 9.2 - 11.8 FL    Nucleated RBCs 0.0 0 PER 100 WBC    nRBC 0.00 0.00 - 0.01 K/uL    Neutrophils % 74 (H) 40 - 73 %    Lymphocytes % 16 (L) 21 - 52 %    Monocytes % 10 3 - 10 %    Eosinophils % 0 0 - 5 %    Basophils % 0 0 - 2 %    Immature Granulocytes 0 0.0 - 0.5 %    Neutrophils Absolute 6.8 1.8 - 8.0 K/UL    Lymphocytes Absolute 1.5 0.9 - 3.6 K/UL    Monocytes Absolute 0.9 0.05 - 1.2 K/UL    Eosinophils Absolute 0.0 0.0 - 0.4 K/UL    Basophils Absolute 0.0 0.0 - 0.1 K/UL    Absolute Immature Granulocyte 0.0 0.00 - 0.04 K/UL    Differential Type AUTOMATED         Assessment and Plan:  Patient appears to be  having uncomplicated post Procedure(s):  LAPAROTOMY WITH RIGHT SALPINGO OOPHORECTOMY LEFT SALPINGECTOMY, LYSIS OF ADHESIONS, CYSTOSCOPY course.  Continue routine post-op care.

## 2021-12-30 NOTE — Progress Notes (Addendum)
0718  Bedside shift change report given to [place] (oncoming nurse) by Delana Meyer RN (offgoing nurse). Report included the following information Nurse Handoff Report.     0730  Assumed care at this time. Patient alert and oriented x4. Denies SOB, chest pain. Shows no signs of distress. Patient lungs clear diminished bilaterally. Cap refill < 3 sec to all extremities. Patient has 20 G IV to L wrist CDI. Stated pain 5/10. Dressing to abdomen is DI with small sanguineous drainage. SCDs applied to BLE. Incentive spirometer at bedside. Patient reaches 1000 on IS. Bowel sounds present. Skin intact. Patient call light and possessions within reach. Bed locked and in lowest position.     [place]  Paged Dr. Bronson Ing. Patient requesting zofran for nausea. Last administered 1229. Verbal order: Zofran 4 mg IV Q4H prn; Phenergan 25 mg po Q6H prn.    1651  Patient IV accidentally was pulled out. Patient tolerating po intake. BP WDL. Urine output sufficient. Paged on call to see if patient needs IV cont fluids. Patient also requesting eyedrops.    1826  Paged on call.     1935  Bedside and verbal shift change report given to [place] by Karl Bales, RN. Report included SBAR, kardex, MAR, and recent results.

## 2021-12-31 MED FILL — IBUPROFEN 600 MG PO TABS: 600 MG | ORAL | Qty: 1

## 2021-12-31 MED FILL — OXYCODONE HCL 5 MG PO TABS: 5 MG | ORAL | Qty: 1

## 2021-12-31 MED FILL — MAG-AL PLUS 200-200-20 MG/5ML PO LIQD: 200-200-20 MG/5ML | ORAL | Qty: 30

## 2021-12-31 MED FILL — STOOL SOFTENER 100 MG PO CAPS: 100 MG | ORAL | Qty: 1

## 2021-12-31 NOTE — Progress Notes (Addendum)
0715  Bedside and verbal shift change report given to  Bonnee Quin, RN by  Loura Back, RN. Report included SBAR, MAR, and recent results.    0850  Patient alert and oriented x4. Denies SOB, chest pain. Shows no signs of distress. Patient lungs clear bilaterally. Cap refill less than 3 sec to all extremities. Patient has 20g IV to left AC saline locked CDI. Stated pain 2/10. Dressing to medial abdomen is DI with small sanguineous drainage. SCD's applied to BLE. Incentive spirometer at bedside. Patient reaches 1000 on IS. Bowel sounds active. Skin intact. Patient call light and possessions within reach. Bed locked and in lowest position.     0900  Patients daughter called and notified pt will discharge this morning.    5364  Patient ambulating in hallway.    1051  PRN MAALOX given for indigestion.

## 2021-12-31 NOTE — Progress Notes (Signed)
Discharge instructions reviewed with the patient. Patient verbalized understanding and verified by teach back. All questions answered. IV discontinued, no redness, swelling or pain noted. Patient discharged off the unit via wheelchair.

## 2021-12-31 NOTE — Discharge Summary (Signed)
Gynecology Surgical Discharge Summary     Name: Lisa Meza MRN: [id number]  SSN: [id number]    Date of Birth: May 15, 1960  Age: 61 y.o.  Sex: female      Admit date: 12/29/2021    Discharge Date: 12/31/2021      Attending Physician: Valarie Merino, MD     Admission Diagnoses: Right Ovarian cyst     Discharge Diagnoses: Principal Problem (Resolved):    Pelvic mass  Active Problems:    * No active hospital problems. *       Procedures:  left salpingectomy/right salpingoophorectomy by laparotomy/lysis of adhesions    Hospital Course: Normal hospital course for this procedure.    Significant Diagnostic Studies: No results found for this or any previous visit (from the past 24 hour(s)).    Patient Instructions:   Current Discharge Medication List        CONTINUE these medications which have NOT CHANGED    Details   valsartan-hydroCHLOROthiazide (DIOVAN-HCT) 160-12.5 MG per tablet Take 1 tablet by mouth daily      omeprazole (PRILOSEC) 20 MG delayed release capsule Take 1 capsule by mouth daily      vitamin D (ERGOCALCIFEROL) 1.25 MG (50000 UT) CAPS capsule once a week      alendronate (FOSAMAX) 70 MG tablet once a week      calcium carbonate 1500 (600 Ca) MG TABS tablet daily      Omega-3 Fatty Acids (FISH OIL) 1000 MG CPDR Take 3 capsules by mouth every other day            Activity: No sex, douching, or tampons for 6 weeks or as directed by your physician. No heavy lifting for 6 weeks. No driving while taking pain medication.  Diet: Resume pre-hospital diet  Wound Care: keep wound clean and dry    No follow-ups on file.     Signed By:  Markus Jarvis, MD     December 31, 2021

## 2021-12-31 NOTE — Progress Notes (Signed)
Gynecology Progress Note    Patient doing well post-op day 2 from Procedure(s):  LAPAROTOMY WITH RIGHT SALPINGO OOPHORECTOMY LEFT SALPINGECTOMY, LYSIS OF ADHESIONS, CYSTOSCOPY without significant complaints.  Pain controlled on current medication.  Voiding without difficulty. Patient both labs and BP are within the desired range, prescription will be faxed electronically for a supply adequate to last until 1 week recheck appointment passing flatus.    Vitals:  Blood pressure 117/68, pulse 95, temperature 98.6 F (37 C), temperature source Oral, resp. rate 16, height 5\' 6"  (1.676 m), weight 176 lb 14.4 oz (80.2 kg), SpO2 96 %.  Temp (24hrs), Avg:98.7 F (37.1 C), Min:98.2 F (36.8 C), Max:99.1 F (37.3 C)        Exam:  Patient without distress.               Abdomen soft,  nontender.                 Incision dry and clean without erythema.               Lower extremities are negative for swelling, cords, or tenderness.    Labs: No results found for this or any previous visit (from the past 24 hour(s)).    Assessment and Plan:  Patient appears to be having uncomplicated post Procedure(s):  LAPAROTOMY WITH RIGHT SALPINGO OOPHORECTOMY LEFT SALPINGECTOMY, LYSIS OF ADHESIONS, CYSTOSCOPY course.  Continue routine post-op care.

## 2021-12-31 NOTE — Plan of Care (Signed)
Problem: Pain  Goal: Verbalizes/displays adequate comfort level or baseline comfort level  Outcome: Adequate for Discharge     Problem: Discharge Planning  Goal: Discharge to home or other facility with appropriate resources  Outcome: Adequate for Discharge     Problem: ABCDS Injury Assessment  Goal: Absence of physical injury  Outcome: Adequate for Discharge     Problem: Safety - Adult  Goal: Free from fall injury  Outcome: Adequate for Discharge

## 2021-12-31 NOTE — Discharge Instructions (Signed)
Activity: No sex, douching, or tampons for 6 weeks or as directed by your physician. No heavy lifting for 6 weeks. No driving while taking pain medication.    Diet: Resume pre-hospital diet    Wound Care: Keep wound clean and dry

## 2021-12-31 NOTE — Progress Notes (Signed)
Pt voided multiple times this shift but failed to report output to nurse and constantly flushed urine.

## 2021-12-31 NOTE — Progress Notes (Signed)
Patient stated MD indicated she will have prescriptions for nausea medication and pain medication for discharge. No new prescriptions noted on discharge avs or discharge summary note. This nurse called to [facility] and they stated there are two prescriptions (norco and promethazine). Patient made aware.

## 2022-03-10 ENCOUNTER — Ambulatory Visit: Payer: PRIVATE HEALTH INSURANCE | Attending: Adult Health

## 2022-05-07 ENCOUNTER — Other Ambulatory Visit: Payer: Self-pay

## 2022-05-07 ENCOUNTER — Emergency Department (HOSPITAL_BASED_OUTPATIENT_CLINIC_OR_DEPARTMENT_OTHER)
Admission: EM | Admit: 2022-05-07 | Discharge: 2022-05-07 | Disposition: A | Discharge status: Home / Self Care | Payer: 59 | Attending: Emergency Medicine | Admitting: Emergency Medicine

## 2022-05-07 DIAGNOSIS — Z7982 Long term (current) use of aspirin: Secondary | ICD-10-CM | POA: Diagnosis not present

## 2022-05-07 DIAGNOSIS — G44019 Episodic cluster headache, not intractable: Secondary | ICD-10-CM | POA: Diagnosis not present

## 2022-05-07 DIAGNOSIS — R519 Headache, unspecified: Secondary | ICD-10-CM | POA: Diagnosis present

## 2022-05-07 MED ORDER — PROCHLORPERAZINE MALEATE 10 MG PO TABS
10.0000 mg | ORAL_TABLET | Freq: Once | ORAL | Status: AC
  Administered 2022-05-07: 10 mg via ORAL
  Filled 2022-05-07: qty 1

## 2022-05-07 MED ORDER — DIPHENHYDRAMINE HCL 25 MG PO CAPS
25.0000 mg | ORAL_CAPSULE | Freq: Once | ORAL | Status: AC
  Administered 2022-05-07: 25 mg via ORAL
  Filled 2022-05-07: qty 1

## 2022-05-07 MED ORDER — KETOROLAC TROMETHAMINE 15 MG/ML IJ SOLN
15.0000 mg | Freq: Once | INTRAMUSCULAR | Status: AC
  Administered 2022-05-07: 15 mg via INTRAVENOUS
  Filled 2022-05-07: qty 1

## 2022-05-07 NOTE — ED Provider Notes (Signed)
[facility] Provider Note   CSN: [id number] Arrival date & time: 05/07/22  1620     History Chief Complaint  Patient presents with   Headache    Tanya Cline is a 62 y.o. female.   Headache Associated symptoms: no fatigue, no fever, no numbness and no weakness   Patient presents emergency department complaints of headache.  She reports this headaches been present off and on for the last 7 days and feels like it is an ice pick into her left eye.  She reports that she is concerned about possible sinus issues but she denies any runny nose, congestion, fevers.  Patient denies any prior history of migraine headaches.  Patient has noticed some time with headaches as they typically occur in the evening if she is running right for bed and they will typically ease up in the morning during the day.  Patient has tried over-the-counter treatment options but does not report any significant improvement in symptoms.     Home Medications Prior to Admission medications   Medication Sig Start Date End Date Taking? Authorizing Provider  aspirin EC 81 MG tablet Take 81 mg by mouth 2 (two) times a week.     [provider]  clonazePAM (KLONOPIN) 0.5 MG tablet Take 0.5 mg by mouth 2 (two) times daily as needed for anxiety.    [provider]  meclizine (ANTIVERT) 25 MG tablet Take 1 tablet (25 mg total) by mouth 3 (three) times daily as needed for dizziness. 08/18/17   Coral Spikes, DO  Multiple Vitamin (MULTIVITAMIN) tablet Take 1 tablet by mouth daily.    [provider]  verapamil (CALAN-SR) 180 MG CR tablet Take 180 mg by mouth daily. 09/29/16   [provider]      Allergies    Codeine    Review of Systems   Review of Systems  Constitutional:  Negative for fatigue and fever.  Respiratory:  Negative for chest tightness and shortness of breath.   Cardiovascular:  Negative for chest pain.  Neurological:   Positive for headaches. Negative for weakness, light-headedness and numbness.  All other systems reviewed and are negative.   Physical Exam Updated Vital Signs BP 123/88   Pulse 94   Temp 97.8 F (36.6 C) (Oral)   Resp 17   Ht '5\' 6"'$  (1.676 m)   Wt 74.8 kg   LMP 06/04/2016   SpO2 96%   BMI 26.63 kg/m  Physical Exam Vitals and nursing note reviewed.  Constitutional:      General: She is not in acute distress.    Appearance: She is well-developed. She is not ill-appearing.  HENT:     Head: Normocephalic and atraumatic.     Mouth/Throat:     Mouth: Mucous membranes are moist.     Pharynx: Oropharynx is clear.  Cardiovascular:     Rate and Rhythm: Normal rate and regular rhythm.  Pulmonary:     Effort: Pulmonary effort is normal.     Breath sounds: Normal breath sounds.  Abdominal:     General: Bowel sounds are normal.     Palpations: Abdomen is soft.  Neurological:     Mental Status: She is alert and oriented to person, place, and time. Mental status is at baseline.     Cranial Nerves: No cranial nerve deficit or facial asymmetry.     Sensory: No sensory deficit.  Psychiatric:        Mood and  Affect: Mood normal.        Speech: Speech normal.        Behavior: Behavior normal.     ED Results / Procedures / Treatments   Labs (all labs ordered are listed, but only abnormal results are displayed) Labs Reviewed - No data to display  EKG None  Radiology No results found.  Procedures Procedures   Medications Ordered in ED Medications  prochlorperazine (COMPAZINE) tablet 10 mg (10 mg Oral Given 05/07/22 1945)  diphenhydrAMINE (BENADRYL) capsule 25 mg (25 mg Oral Given 05/07/22 1945)  ketorolac (TORADOL) 15 MG/ML injection 15 mg (15 mg Intravenous Given 05/07/22 2001)    ED Course/ Medical Decision Making/ A&P                           Medical Decision Making  This patient presents to the ED for concern of headaches.  Differential diagnosis includes migraine  headache, cluster headache, tension headache, viral URI   Medicines ordered and prescription drug management:  I ordered medication including Toradol, Compazine, Benadryl for cluster headache Reevaluation of the patient after these medicines showed that the patient improved I have reviewed the patients home medicines and have made adjustments as needed   Problem List / ED Course:  Patient presents emergency department complaints of headache.  She reports this headache has been persistent for the last 7 days with some interval timing with headache typically worse at night as she gets ready for bed.  She reports trying over-the-counter medication without significant treatment.  Based on patient's description of left eye pain present with this headache is persistent feels excellently stabbing into her eye, this appears to fit the typical picture of cluster headaches.  Patient was given a headache cocktail consisting of Compazine, Benadryl, and Toradol which did appear to alleviate her headache symptoms at this time.  Advised patient that she should plan to follow-up with her primary care provider for further evaluation and treatment as needed as cluster headaches can be persistent and may take some time to fully resolve and regress.  Patient verbalized understanding all return precautions and was agreeable with plan to discharge home.   Final Clinical Impression(s) / ED Diagnoses Final diagnoses:  Episodic cluster headache, not intractable    Rx / DC Orders ED Discharge Orders     None         Vladimir Creeks 05/07/22 2112    Drenda Freeze, MD 05/07/22 [phone]

## 2022-05-07 NOTE — ED Triage Notes (Signed)
Patient presents to ED via POV from home. Here with headache x 1 week. Endorses nausea, denies vomiting. Reports left eye pain as well.

## 2022-05-07 NOTE — Discharge Instructions (Addendum)
You are seen in the emergency department today for cluster headaches.  You were given a headache medication cocktail consisting of Compazine, Toradol, Benadryl which appeared to improve the severity of her headache. You should try over the counter medications for headaches as they arise, but make sure you can establish care with a primary care provider for further evaluation if headaches return and do not subside.
